# Patient Record
Sex: Male | Born: 1950 | Race: White | Hispanic: No | Marital: Single | Smoking: Former smoker
Health system: Southern US, Community
[De-identification: ages and names within clinical notes are randomized; demographics above are authoritative.]

## PROBLEM LIST (undated history)

## (undated) DIAGNOSIS — R011 Cardiac murmur, unspecified: Secondary | ICD-10-CM

## (undated) DIAGNOSIS — R062 Wheezing: Secondary | ICD-10-CM

## (undated) DIAGNOSIS — R42 Dizziness and giddiness: Secondary | ICD-10-CM

## (undated) DIAGNOSIS — M1990 Unspecified osteoarthritis, unspecified site: Secondary | ICD-10-CM

## (undated) DIAGNOSIS — I2699 Other pulmonary embolism without acute cor pulmonale: Secondary | ICD-10-CM

## (undated) DIAGNOSIS — K219 Gastro-esophageal reflux disease without esophagitis: Secondary | ICD-10-CM

## (undated) DIAGNOSIS — H9190 Unspecified hearing loss, unspecified ear: Secondary | ICD-10-CM

## (undated) DIAGNOSIS — Z9109 Other allergy status, other than to drugs and biological substances: Secondary | ICD-10-CM

## (undated) DIAGNOSIS — E7800 Pure hypercholesterolemia, unspecified: Secondary | ICD-10-CM

## (undated) DIAGNOSIS — K5790 Diverticulosis of intestine, part unspecified, without perforation or abscess without bleeding: Secondary | ICD-10-CM

## (undated) DIAGNOSIS — N200 Calculus of kidney: Secondary | ICD-10-CM

## (undated) DIAGNOSIS — K449 Diaphragmatic hernia without obstruction or gangrene: Secondary | ICD-10-CM

## (undated) HISTORY — PX: TONSILLECTOMY: SUR1361

## (undated) HISTORY — PX: OTHER SURGICAL HISTORY: SHX169

---
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
## 2006-04-11 DIAGNOSIS — IMO0001 Reserved for inherently not codable concepts without codable children: Secondary | ICD-10-CM | POA: Insufficient documentation

## 2006-05-22 ENCOUNTER — Ambulatory Visit: Payer: Self-pay | Admitting: Gastroenterology

## 2006-06-10 HISTORY — PX: COLONOSCOPY: SHX174

## 2007-04-21 LAB — HM COLONOSCOPY

## 2007-05-14 DIAGNOSIS — R351 Nocturia: Secondary | ICD-10-CM | POA: Insufficient documentation

## 2008-02-01 DIAGNOSIS — L259 Unspecified contact dermatitis, unspecified cause: Secondary | ICD-10-CM | POA: Insufficient documentation

## 2010-09-12 ENCOUNTER — Emergency Department: Payer: Self-pay

## 2013-01-05 ENCOUNTER — Ambulatory Visit: Payer: Self-pay | Admitting: Family Medicine

## 2013-11-08 ENCOUNTER — Ambulatory Visit: Payer: Self-pay | Admitting: Family Medicine

## 2014-05-21 DIAGNOSIS — N419 Inflammatory disease of prostate, unspecified: Secondary | ICD-10-CM | POA: Diagnosis not present

## 2014-06-28 DIAGNOSIS — Z125 Encounter for screening for malignant neoplasm of prostate: Secondary | ICD-10-CM | POA: Diagnosis not present

## 2014-06-28 DIAGNOSIS — Z23 Encounter for immunization: Secondary | ICD-10-CM | POA: Diagnosis not present

## 2014-06-28 DIAGNOSIS — Z0000 Encounter for general adult medical examination without abnormal findings: Secondary | ICD-10-CM | POA: Diagnosis not present

## 2014-06-28 DIAGNOSIS — Z1389 Encounter for screening for other disorder: Secondary | ICD-10-CM | POA: Diagnosis not present

## 2014-06-30 DIAGNOSIS — N400 Enlarged prostate without lower urinary tract symptoms: Secondary | ICD-10-CM | POA: Diagnosis not present

## 2014-06-30 DIAGNOSIS — E780 Pure hypercholesterolemia: Secondary | ICD-10-CM | POA: Diagnosis not present

## 2014-06-30 LAB — HEPATIC FUNCTION PANEL
ALT: 19 U/L (ref 10–40)
AST: 21 U/L (ref 14–40)
Alkaline Phosphatase: 77 U/L (ref 25–125)
BILIRUBIN, TOTAL: 0.4 mg/dL

## 2014-06-30 LAB — BASIC METABOLIC PANEL
BUN: 21 mg/dL (ref 4–21)
Creatinine: 1.6 mg/dL — AB (ref 0.6–1.3)
POTASSIUM: 3.9 mmol/L (ref 3.4–5.3)
Sodium: 144 mmol/L (ref 137–147)

## 2014-06-30 LAB — LIPID PANEL
CHOLESTEROL: 184 mg/dL (ref 0–200)
HDL: 55 mg/dL (ref 35–70)
LDL Cholesterol: 114 mg/dL
TRIGLYCERIDES: 73 mg/dL (ref 40–160)

## 2014-06-30 LAB — PSA: PSA: 18.4

## 2014-07-19 DIAGNOSIS — C61 Malignant neoplasm of prostate: Secondary | ICD-10-CM | POA: Diagnosis not present

## 2014-08-01 DIAGNOSIS — Z23 Encounter for immunization: Secondary | ICD-10-CM | POA: Diagnosis not present

## 2014-08-01 DIAGNOSIS — Z125 Encounter for screening for malignant neoplasm of prostate: Secondary | ICD-10-CM | POA: Diagnosis not present

## 2014-08-01 DIAGNOSIS — N400 Enlarged prostate without lower urinary tract symptoms: Secondary | ICD-10-CM | POA: Diagnosis not present

## 2014-08-01 DIAGNOSIS — R338 Other retention of urine: Secondary | ICD-10-CM | POA: Diagnosis not present

## 2014-08-17 DIAGNOSIS — R339 Retention of urine, unspecified: Secondary | ICD-10-CM | POA: Diagnosis not present

## 2014-09-23 DIAGNOSIS — D495 Neoplasm of unspecified behavior of other genitourinary organs: Secondary | ICD-10-CM | POA: Diagnosis not present

## 2014-09-29 DIAGNOSIS — Z125 Encounter for screening for malignant neoplasm of prostate: Secondary | ICD-10-CM | POA: Diagnosis not present

## 2014-09-29 DIAGNOSIS — N400 Enlarged prostate without lower urinary tract symptoms: Secondary | ICD-10-CM | POA: Diagnosis not present

## 2014-09-29 DIAGNOSIS — Z23 Encounter for immunization: Secondary | ICD-10-CM | POA: Diagnosis not present

## 2014-09-29 DIAGNOSIS — T801XXA Vascular complications following infusion, transfusion and therapeutic injection, initial encounter: Secondary | ICD-10-CM | POA: Diagnosis not present

## 2014-09-30 DIAGNOSIS — N400 Enlarged prostate without lower urinary tract symptoms: Secondary | ICD-10-CM | POA: Diagnosis not present

## 2014-10-03 DIAGNOSIS — Z125 Encounter for screening for malignant neoplasm of prostate: Secondary | ICD-10-CM | POA: Diagnosis not present

## 2014-10-03 DIAGNOSIS — R0789 Other chest pain: Secondary | ICD-10-CM | POA: Diagnosis not present

## 2014-10-03 DIAGNOSIS — T801XXD Vascular complications following infusion, transfusion and therapeutic injection, subsequent encounter: Secondary | ICD-10-CM | POA: Diagnosis not present

## 2014-10-03 DIAGNOSIS — Z23 Encounter for immunization: Secondary | ICD-10-CM | POA: Diagnosis not present

## 2014-10-13 ENCOUNTER — Ambulatory Visit
Admission: RE | Admit: 2014-10-13 | Discharge: 2014-10-13 | Disposition: A | Discharge status: Home / Self Care | Payer: Medicare Other | Source: Ambulatory Visit | Attending: Family Medicine | Admitting: Family Medicine

## 2014-10-13 ENCOUNTER — Other Ambulatory Visit: Payer: Self-pay | Admitting: Family Medicine

## 2014-10-13 DIAGNOSIS — Z125 Encounter for screening for malignant neoplasm of prostate: Secondary | ICD-10-CM | POA: Diagnosis not present

## 2014-10-13 DIAGNOSIS — R05 Cough: Secondary | ICD-10-CM

## 2014-10-13 DIAGNOSIS — Z23 Encounter for immunization: Secondary | ICD-10-CM | POA: Diagnosis not present

## 2014-10-13 DIAGNOSIS — R0989 Other specified symptoms and signs involving the circulatory and respiratory systems: Secondary | ICD-10-CM

## 2014-10-13 DIAGNOSIS — R059 Cough, unspecified: Secondary | ICD-10-CM

## 2014-10-13 DIAGNOSIS — R0789 Other chest pain: Secondary | ICD-10-CM | POA: Diagnosis not present

## 2014-10-13 DIAGNOSIS — R079 Chest pain, unspecified: Secondary | ICD-10-CM | POA: Diagnosis not present

## 2014-10-13 DIAGNOSIS — J90 Pleural effusion, not elsewhere classified: Secondary | ICD-10-CM | POA: Insufficient documentation

## 2014-10-13 LAB — CBC AND DIFFERENTIAL
HEMATOCRIT: 50 % (ref 41–53)
Hemoglobin: 16.9 g/dL (ref 13.5–17.5)
Neutrophils Absolute: 69 /uL
Platelets: 314 10*3/uL (ref 150–399)
WBC: 10.8 10^3/mL

## 2014-10-14 ENCOUNTER — Emergency Department
Admission: EM | Admit: 2014-10-14 | Discharge: 2014-10-15 | Disposition: A | Discharge status: Home / Self Care | Payer: Medicare Other | Attending: Emergency Medicine | Admitting: Emergency Medicine

## 2014-10-14 ENCOUNTER — Encounter: Payer: Self-pay | Admitting: *Deleted

## 2014-10-14 ENCOUNTER — Other Ambulatory Visit: Payer: Self-pay | Admitting: Family Medicine

## 2014-10-14 ENCOUNTER — Ambulatory Visit
Admission: RE | Admit: 2014-10-14 | Discharge: 2014-10-14 | Disposition: A | Discharge status: Home / Self Care | Payer: Medicare Other | Source: Ambulatory Visit | Attending: Family Medicine | Admitting: Family Medicine

## 2014-10-14 ENCOUNTER — Emergency Department: Payer: Medicare Other

## 2014-10-14 DIAGNOSIS — R079 Chest pain, unspecified: Secondary | ICD-10-CM | POA: Diagnosis present

## 2014-10-14 DIAGNOSIS — K449 Diaphragmatic hernia without obstruction or gangrene: Secondary | ICD-10-CM | POA: Diagnosis not present

## 2014-10-14 DIAGNOSIS — K760 Fatty (change of) liver, not elsewhere classified: Secondary | ICD-10-CM | POA: Insufficient documentation

## 2014-10-14 DIAGNOSIS — R0789 Other chest pain: Secondary | ICD-10-CM | POA: Diagnosis present

## 2014-10-14 DIAGNOSIS — J90 Pleural effusion, not elsewhere classified: Secondary | ICD-10-CM | POA: Insufficient documentation

## 2014-10-14 DIAGNOSIS — I2699 Other pulmonary embolism without acute cor pulmonale: Secondary | ICD-10-CM | POA: Insufficient documentation

## 2014-10-14 DIAGNOSIS — Z87891 Personal history of nicotine dependence: Secondary | ICD-10-CM | POA: Insufficient documentation

## 2014-10-14 DIAGNOSIS — R7989 Other specified abnormal findings of blood chemistry: Secondary | ICD-10-CM

## 2014-10-14 DIAGNOSIS — M7989 Other specified soft tissue disorders: Secondary | ICD-10-CM | POA: Diagnosis not present

## 2014-10-14 DIAGNOSIS — R791 Abnormal coagulation profile: Secondary | ICD-10-CM | POA: Diagnosis not present

## 2014-10-14 DIAGNOSIS — R0602 Shortness of breath: Secondary | ICD-10-CM | POA: Diagnosis not present

## 2014-10-14 DIAGNOSIS — J9811 Atelectasis: Secondary | ICD-10-CM | POA: Diagnosis not present

## 2014-10-14 LAB — CBC
HCT: 48.2 % (ref 40.0–52.0)
HEMOGLOBIN: 16.6 g/dL (ref 13.0–18.0)
MCH: 31 pg (ref 26.0–34.0)
MCHC: 34.5 g/dL (ref 32.0–36.0)
MCV: 90.1 fL (ref 80.0–100.0)
Platelets: 277 10*3/uL (ref 150–440)
RBC: 5.35 MIL/uL (ref 4.40–5.90)
RDW: 13.9 % (ref 11.5–14.5)
WBC: 14.5 10*3/uL — ABNORMAL HIGH (ref 3.8–10.6)

## 2014-10-14 LAB — BASIC METABOLIC PANEL
Anion gap: 8 (ref 5–15)
BUN: 20 mg/dL (ref 6–20)
CALCIUM: 8.7 mg/dL — AB (ref 8.9–10.3)
CO2: 27 mmol/L (ref 22–32)
CREATININE: 1.3 mg/dL — AB (ref 0.61–1.24)
Chloride: 102 mmol/L (ref 101–111)
GFR calc Af Amer: >60 mL/min (ref >60)
GFR calc non Af Amer: 56 mL/min — ABNORMAL LOW (ref >60)
GLUCOSE: 77 mg/dL (ref 65–99)
Potassium: 3.8 mmol/L (ref 3.5–5.1)
Sodium: 137 mmol/L (ref 135–145)

## 2014-10-14 MED ORDER — RIVAROXABAN 15 MG PO TABS
15.0000 mg | ORAL_TABLET | Freq: Once | ORAL | Status: AC
  Administered 2014-10-14: 15 mg via ORAL
  Filled 2014-10-14: qty 1

## 2014-10-14 MED ORDER — RIVAROXABAN (XARELTO) VTE STARTER PACK (15 & 20 MG): ORAL_TABLET | ORAL | Status: DC

## 2014-10-14 MED ORDER — IOHEXOL 350 MG/ML SOLN
100.0000 mL | Freq: Once | INTRAVENOUS | Status: AC | PRN
  Administered 2014-10-14: 100 mL via INTRAVENOUS

## 2014-10-14 NOTE — ED Notes (Signed)
Await paperwork and prescription. Pt

## 2014-10-14 NOTE — ED Notes (Signed)
Pt presented to primary doc w/ c;o right rib and chest pain x more than a week ago. Pt was treated at that time w/ muscle relaxers w/ further testing. Pt states developed a dry cough in past week w/ moderately resolving R chest pain. Pt presents w/ results of blood testing and imaging that confirm potential for PE.

## 2014-10-14 NOTE — Discharge Instructions (Signed)
You have a pulmonary emboli in the right lung. This appears stable. Your vital signs are within normal limits. Traction level is good. We agreed to use one of the newer anticoagulant medications for you. Take Xarelto twice a day as prescribed. This will be changed to a once a day dosing in 4 weeks.  Follow-up with your regular doctor this coming week.   Pulmonary Embolism A pulmonary (lung) embolism (PE) is a blood clot that has traveled to the lung and results in a blockage of blood flow in the affected lung. Most clots come from deep veins in the legs or pelvis. PE is a dangerous and potentially life-threatening condition that can be treated if identified. CAUSES Blood clots form in a vein for different reasons. Usually several things cause blood clots. They include:  The flow of blood slows down.  The inside of the vein is damaged in some way.  The person has a condition that makes the blood clot more easily. RISK FACTORS Some people are more likely than others to develop PE. Risk factors include:   Smoking.  Being overweight (obese).  Sitting or lying still for a long time. This includes long-distance travel, paralysis, or recovery from an illness or surgery. Other factors that increase risk are:   Older age, especially over 30 years of age.  Having a family history of blood clots or if you have already had a blood clot.  Having major or lengthy surgery. This is especially true for surgery on the hip, knee, or belly (abdomen). Hip surgery is particularly high risk.  Having a long, thin tube (catheter) placed inside a vein during a medical procedure.  Breaking a hip or leg.  Having cancer or cancer treatment.  Medicines containing the male hormone estrogen. This includes birth control pills and hormone replacement therapy.  Other circulation or heart problems.  Pregnancy and childbirth.  Hormone changes make the blood clot more easily during pregnancy.  The fetus puts  pressure on the veins of the pelvis.  There is a risk of injury to veins during delivery or a caesarean delivery. The risk is highest just after childbirth.  PREVENTION   Exercise the legs regularly. Take a brisk 30 minute walk every day.  Maintain a weight that is appropriate for your height.  Avoid sitting or lying in bed for long periods of time without moving your legs.  Women, particularly those over the age of 11 years, should consider the risks and benefits of taking estrogen medicines, including birth control pills.  Do not smoke, especially if you take estrogen medicines.  Long-distance travel can increase your risk. You should exercise your legs by walking or pumping the muscles every hour.  Many of the risk factors above relate to situations that exist with hospitalization, either for illness, injury, or elective surgery. Prevention may include medical and nonmedical measures.   Your health care provider will assess you for the need for venous thromboembolism prevention when you are admitted to the hospital. If you are having surgery, your surgeon will assess you the day of or day after surgery.  SYMPTOMS  The symptoms of a PE usually start suddenly and include:  Shortness of breath.  Coughing.  Coughing up blood or blood-tinged mucus.  Chest pain. Pain is often worse with deep breaths.  Rapid heartbeat. DIAGNOSIS  If a PE is suspected, your health care provider will take a medical history and perform a physical exam. Other tests that may be required include:  Blood tests, such as studies of the clotting properties of your blood.  Imaging tests, such as ultrasound, CT, MRI, and other tests to see if you have clots in your legs or lungs.  An electrocardiogram. This can look for heart strain from blood clots in the lungs. TREATMENT   The most common treatment for a PE is blood thinning (anticoagulant) medicine, which reduces the blood's tendency to clot.  Anticoagulants can stop new blood clots from forming and old clots from growing. They cannot dissolve existing clots. Your body does this by itself over time. Anticoagulants can be given by mouth, through an intravenous (IV) tube, or by injection. Your health care provider will determine the best program for you.  Less commonly, clot-dissolving medicines (thrombolytics) are used to dissolve a PE. They carry a high risk of bleeding, so they are used mainly in severe cases.  Very rarely, a blood clot in the leg needs to be removed surgically.  If you are unable to take anticoagulants, your health care provider may arrange for you to have a filter placed in a main vein in your abdomen. This filter prevents clots from traveling to your lungs. HOME CARE INSTRUCTIONS   Take all medicines as directed by your health care provider.  Learn as much as you can about DVT.  Wear a medical alert bracelet or carry a medical alert card.  Ask your health care provider how soon you can go back to normal activities. It is important to stay active to prevent blood clots. If you are on anticoagulant medicine, avoid contact sports.  It is very important to exercise. This is especially important while traveling, sitting, or standing for long periods of time. Exercise your legs by walking or by tightening and relaxing your leg muscles regularly. Take frequent walks.  You may need to wear compression stockings. These are tight elastic stockings that apply pressure to the lower legs. This pressure can help keep the blood in the legs from clotting. Taking Warfarin Warfarin is a daily medicine that is taken by mouth. Your health care provider will advise you on the length of treatment (usually 3-6 months, sometimes lifelong). If you take warfarin:  Understand how to take warfarin and foods that can affect how warfarin works in Veterinary surgeon.  Too much and too little warfarin are both dangerous. Too much warfarin increases  the risk of bleeding. Too little warfarin continues to allow the risk for blood clots. Warfarin and Regular Blood Testing While taking warfarin, you will need to have regular blood tests to measure your blood clotting time. These blood tests usually include both the prothrombin time (PT) and international normalized ratio (INR) tests. The PT and INR results allow your health care provider to adjust your dose of warfarin. It is very important that you have your PT and INR tested as often as directed by your health care provider.  Warfarin and Your Diet Avoid major changes in your diet, or notify your health care provider before changing your diet. Arrange a visit with a registered dietitian to answer your questions. Many foods, especially foods high in vitamin K, can interfere with warfarin and affect the PT and INR results. You should eat a consistent amount of foods high in vitamin K. Foods high in vitamin K include:   Spinach, kale, broccoli, cabbage, collard and turnip greens, Brussels sprouts, peas, cauliflower, seaweed, and parsley.  Beef and pork liver.  Green tea.  Soybean oil. Warfarin with Other Medicines Many medicines can  interfere with warfarin and affect the PT and INR results. You must:  Tell your health care provider about any and all medicines, vitamins, and supplements you take, including aspirin and other over-the-counter anti-inflammatory medicines. Be especially cautious with aspirin and anti-inflammatory medicines. Ask your health care provider before taking these.  Do not take or discontinue any prescribed or over-the-counter medicine except on the advice of your health care provider or pharmacist. Warfarin Side Effects Warfarin can have side effects, such as easy bruising and difficulty stopping bleeding. Ask your health care provider or pharmacist about other side effects of warfarin. You will need to:  Hold pressure over cuts for longer than usual.  Notify your  dentist and other health care providers that you are taking warfarin before you undergo any procedures where bleeding may occur. Warfarin with Alcohol and Tobacco   Drinking alcohol frequently can increase the effect of warfarin, leading to excess bleeding. It is best to avoid alcoholic drinks or consume only very small amounts while taking warfarin. Notify your health care provider if you change your alcohol intake.  Do not use any tobacco products including cigarettes, chewing tobacco, or electronic cigarettes. If you smoke, quit. Ask your health care provider for help with quitting smoking. Alternative Medicines to Warfarin: Factor Xa Inhibitor Medicines  These blood thinning medicines are taken by mouth, usually for several weeks or longer. It is important to take the medicine every single day, at the same time each day.  There are no regular blood tests required when using these medicines.  There are fewer food and drug interactions than with warfarin.  The side effects of this class of medicine is similar to that of warfarin, including excessive bruising or bleeding. Ask your health care provider or pharmacist about other potential side effects. SEEK MEDICAL CARE IF:   You notice a rapid heartbeat.  You feel weaker or more tired than usual.  You feel faint.  You notice increased bruising.  Your symptoms are not getting better in the time expected.  You are having side effects of medicine. SEEK IMMEDIATE MEDICAL CARE IF:   You have chest pain.  You have trouble breathing.  You have new or increased swelling or pain in one leg.  You cough up blood.  You notice blood in vomit, in a bowel movement, or in urine.  You have a fever. Symptoms of PE may represent a serious problem that is an emergency. Do not wait to see if the symptoms will go away. Get medical help right away. Call your local emergency services (911 in the [place]). Do not drive yourself to the  hospital. Document Released: 05/24/2000 Document Revised: 10/11/2013 Document Reviewed: 06/07/2013 [facility] Patient Information 2015 [place], [place]. This information is not intended to replace advice given to you by your health care provider. Make sure you discuss any questions you have with your health care provider.

## 2014-10-14 NOTE — ED Notes (Signed)
Called [place] to get ETA and they state he is next

## 2014-10-14 NOTE — ED Notes (Signed)
Await results from doppler and then pt will be d/c.  Pt clm and patient

## 2014-10-14 NOTE — ED Provider Notes (Signed)
[facility] Emergency Department Provider Note  ____________________________________________  Time seen: 1930  I have reviewed the triage vital signs and the nursing notes.   HISTORY  Chief Complaint Chest Pain  right-sided chest pain, noted pulmonary emboli    HPI Jacob Patterson is a 64 y.o. male who woke up with right-sided chest pain approximately a week and a half ago. He was seen by his primary care physician, Dr. Jeananne Rama. Dr. Maurie Boettcher treated the patient for a pulled muscle. The patient traveled to [place] and back. Upon return he saw Dr. Janae Bridgeman again who pursued further evaluation which included chest x-ray and some blood test yesterday. Due to the results from that evaluation, the patient underwent CT scan chest angiogram today. This revealed that he had a small to moderate area of infarction from pulmonary emboli with a small clot load. He was directed to the emergency department for further evaluation treatment. At this time the patient is not having any chest pain or shortness of breath. His pain previously would be moderate times. He has no prior history of blood clots.   History reviewed. No pertinent past medical history.  There are no active problems to display for this patient.   No past surgical history on file.  No current outpatient prescriptions on file.  Allergies Review of patient's allergies indicates no known allergies.  No family history on file.  Social History History  Substance Use Topics  . Smoking status: Former Smoker    Types: Cigarettes  . Smokeless tobacco: Not on file  . Alcohol Use: Not on file    Review of Systems Constitutional: Negative for fever. ENT: Negative for sore throat. Cardiovascular: Positive for right-sided chest pain as noted in history of present illness  Respiratory: Positive for mild shortness of breath. None currently. Gastrointestinal: Negative for abdominal pain, vomiting and  diarrhea. Genitourinary: Negative for dysuria. Musculoskeletal: Negative for back pain. Skin: Negative for rash. Neurological: Negative for headaches Hematological/Lymphatic:No prior history of blood clots. Patient is noted to be positive for PE at this time.  10-point ROS otherwise negative.  ____________________________________________   PHYSICAL EXAM:  VITAL SIGNS: ED Triage Vitals  Enc Vitals Group     BP 10/14/14 1923 119/85 mmHg     Pulse Rate 10/14/14 1923 100     Resp 10/14/14 1923 18     Temp 10/14/14 1923 98.2 F (36.8 C)     Temp Source 10/14/14 1923 Oral     SpO2 10/14/14 1923 96 %     Weight 10/14/14 1925 183 lb 6.4 oz (83.19 kg)     Height --      Head Cir --      Peak Flow --      Pain Score 10/14/14 1925 0     Pain Loc --      Pain Edu? --      Excl. in [place]? --     Constitutional: Alert and oriented. Well appearing and in no distress. ENT   Head: Normocephalic and atraumatic.   Nose: No congestion/rhinnorhea.   Mouth/Throat: Mucous membranes are moist. Cardiovascular: Normal rate at 91, regular rhythm. Respiratory: Normal respiratory effort without tachypnea. Breath sounds are clear and equal bilaterally. No wheezes/rales/rhonchi. Oxygen saturation normal at 99% on room air. Gastrointestinal: Soft and nontender. No distention.  Back: There is no CVA tenderness. Musculoskeletal: Nontender with normal range of motion in all extremities.  No noted edema. Legs are of equal size bilaterally. Patient reports that his  legs are normal without any change. Neurologic:  Normal speech and language. No gross focal neurologic deficits are appreciated.  Skin:  Skin is warm, dry. No rash noted. Psychiatric: Mood and affect are normal. Speech and behavior are normal.  ____________________________________________     INITIAL IMPRESSION / ASSESSMENT AND PLAN / ED COURSE  The patient arrives with his diagnosis of pulmonary emboli already made by CT scan  earlier today. He is essentially asymptomatic and looks well. We have discussed treatment options including the use of the newer anticoagulants. We have spoken about the risks of these anticoagulants, as there is no readily available immediate reversal agent for them. The patient understands these risks and agrees with ongoing treatment with Xarelto at this time. He has close follow-up available. We expect revealed to discharge him home. While Doppler ultrasounds will not change his clinical course at this point, we will get bilateral Doppler ultrasounds of his legs to evaluate for DVT.   LABS (pertinent positives/negatives)  White blood cell count of 14.5 normal hemoglobin level of 16.6 electrolytes are within normal limits.  ____________________________________________   EKG  At 1917 normal sinus rhythm at rate of 92 with normal intervals. There are flipped T waves in lead 3  ____________________________________________    RADIOLOGY  CAT scan done as an outpatient bar for arrival shows small to medium infarction consistent with pulmonary emboli in the right lung. There is a small clot burden.  Ultrasound of the lower extremities is pending at this time.  ____________________________________________   PROCEDURES  Procedure(s) performed: None  Critical Care performed: No  ____________________________________________  ----------------------------------------- 10:24 PM on 10/14/2014 -----------------------------------------  At this hour we are still waiting for Doppler ultrasounds of lower extremity to be completed. Given the patient's stable appearance we plan on discharging him home on Xarelto. This was discussed with him in detail. Results from the Doppler studies of his lower extremities will not change this plan. I will ask for my colleague to follow-up on the results and communicated the patient.  ____________________________________________   FINAL CLINICAL  IMPRESSION(S) / ED DIAGNOSES  Final diagnoses:  Pulmonary emboli      Ahmed Prima, MD 10/14/14 2226

## 2014-10-25 DIAGNOSIS — R05 Cough: Secondary | ICD-10-CM | POA: Diagnosis not present

## 2014-10-25 DIAGNOSIS — Z125 Encounter for screening for malignant neoplasm of prostate: Secondary | ICD-10-CM | POA: Diagnosis not present

## 2014-10-25 DIAGNOSIS — Z23 Encounter for immunization: Secondary | ICD-10-CM | POA: Diagnosis not present

## 2014-10-25 DIAGNOSIS — I2699 Other pulmonary embolism without acute cor pulmonale: Secondary | ICD-10-CM | POA: Diagnosis not present

## 2014-10-31 DIAGNOSIS — J90 Pleural effusion, not elsewhere classified: Secondary | ICD-10-CM | POA: Insufficient documentation

## 2014-10-31 DIAGNOSIS — N200 Calculus of kidney: Secondary | ICD-10-CM | POA: Insufficient documentation

## 2014-10-31 DIAGNOSIS — N400 Enlarged prostate without lower urinary tract symptoms: Secondary | ICD-10-CM | POA: Insufficient documentation

## 2014-10-31 DIAGNOSIS — E7800 Pure hypercholesterolemia, unspecified: Secondary | ICD-10-CM | POA: Insufficient documentation

## 2014-10-31 DIAGNOSIS — I809 Phlebitis and thrombophlebitis of unspecified site: Secondary | ICD-10-CM | POA: Insufficient documentation

## 2014-10-31 DIAGNOSIS — K219 Gastro-esophageal reflux disease without esophagitis: Secondary | ICD-10-CM | POA: Insufficient documentation

## 2014-10-31 DIAGNOSIS — R002 Palpitations: Secondary | ICD-10-CM | POA: Insufficient documentation

## 2014-10-31 DIAGNOSIS — T801XXA Vascular complications following infusion, transfusion and therapeutic injection, initial encounter: Secondary | ICD-10-CM | POA: Insufficient documentation

## 2014-10-31 DIAGNOSIS — M1990 Unspecified osteoarthritis, unspecified site: Secondary | ICD-10-CM | POA: Insufficient documentation

## 2014-10-31 DIAGNOSIS — K4090 Unilateral inguinal hernia, without obstruction or gangrene, not specified as recurrent: Secondary | ICD-10-CM | POA: Insufficient documentation

## 2014-10-31 DIAGNOSIS — R972 Elevated prostate specific antigen [PSA]: Secondary | ICD-10-CM | POA: Insufficient documentation

## 2014-10-31 DIAGNOSIS — R7989 Other specified abnormal findings of blood chemistry: Secondary | ICD-10-CM | POA: Insufficient documentation

## 2014-10-31 DIAGNOSIS — M25559 Pain in unspecified hip: Secondary | ICD-10-CM | POA: Insufficient documentation

## 2014-12-06 ENCOUNTER — Encounter: Payer: Self-pay | Admitting: Family Medicine

## 2014-12-06 ENCOUNTER — Ambulatory Visit (INDEPENDENT_AMBULATORY_CARE_PROVIDER_SITE_OTHER): Payer: Medicare Other | Admitting: Family Medicine

## 2014-12-06 VITALS — BP 98/62 | HR 78 | Temp 98.5°F | Resp 16 | Wt 188.4 lb

## 2014-12-06 DIAGNOSIS — J069 Acute upper respiratory infection, unspecified: Secondary | ICD-10-CM | POA: Diagnosis not present

## 2014-12-06 DIAGNOSIS — Z86711 Personal history of pulmonary embolism: Secondary | ICD-10-CM | POA: Diagnosis not present

## 2014-12-06 DIAGNOSIS — R5383 Other fatigue: Secondary | ICD-10-CM

## 2014-12-06 DIAGNOSIS — M25511 Pain in right shoulder: Secondary | ICD-10-CM | POA: Diagnosis not present

## 2014-12-06 NOTE — Progress Notes (Signed)
Subjective:    Patient ID: Jacob Patterson, male    DOB: 07/08/1950, 64 y.o.   MRN: [id number]  HPI Follow-up: Initially had sharp right chest pains suspected secondary to phlebitis following IV contrast for MRI and chest x-ray showed right pleural efusion on 10-13-14. On 10-14-14 D-Dimer was 3.97 and CTA of chest showed a small RLL PE with right pleural effusion and a small RLL pulmonary infarct. Treated at [facility] and presently on Eliquis 5 mg BID. Intermittent right chest soreness but no hemoptysis or dyspnea.  Developed "foggy fatigue" the past couple days. Progressed to crusting of eyes, sinus pressure, occipital pressure headache, dry cough and right shoulder discomfort. Family history positive for father having arthritis. Aleve some help with shoulder discomfort. Eating 2 meals a day with some loose stools.  Patient Active Problem List   Diagnosis Date Noted  . Arthritis 10/31/2014  . 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 D-dimer, elevated 10/31/2014  . Abnormal prostate specific antigen 10/31/2014  . Acid reflux 10/31/2014  . Hypercholesteremia 10/31/2014  . Hernia, inguinal 10/31/2014  . Arthralgia of hip 10/31/2014  . Awareness of heartbeats 10/31/2014  . Pleural cavity effusion 10/31/2014  . Phlebitis after infusion 10/31/2014  . Calculus of kidney 10/31/2014  . CD (contact dermatitis) 02/01/2008  . Excessive urination at night 05/14/2007   Past Surgical History  Procedure Laterality Date  . Tonsillectomy and adenoidectomy  1960  . Colonoscopy  2008   History  Substance Use Topics  . Smoking status: Former Smoker    Types: Cigarettes  . Smokeless tobacco: Not on file     Comment: Quit in 1989  . Alcohol Use: 0.0 oz/week    0 Standard drinks or equivalent per week     Comment: occasionally drinks wine or beer   Family History  Problem Relation Age of Onset  . Arthritis Mother   . Stroke Mother   . Arthritis Father   . Dementia Father   . Cataracts Father   .  Hyperlipidemia Sister   . Breast cancer Paternal Grandmother   . Heart attack Paternal Grandfather    Current Outpatient Prescriptions on File Prior to Visit  Medication Sig Dispense Refill  . aspirin EC 81 MG tablet Take 81 mg by mouth every evening.    Marland Kitchen B-Complex TABS Take 1 tablet by mouth every morning.    . Cholecalciferol (VITAMIN D3) 2000 UNITS TABS Take 1 tablet by mouth every morning.    . finasteride (PROSCAR) 5 MG tablet Take 5 mg by mouth every morning.    . Magnesium 250 MG TABS Take 1 tablet by mouth every morning.    . Melatonin 1 MG TABS Take 0.25 tablets by mouth as needed.    . Multiple Vitamins-Minerals (MULTIVITAMIN PO) Take 1 tablet by mouth every morning.    . naproxen sodium (ANAPROX) 220 MG tablet Take 220 mg by mouth 2 (two) times daily.    . tamsulosin (FLOMAX) 0.4 MG CAPS capsule Take 0.4 mg by mouth daily after supper.    . TURMERIC PO Take 1 capsule by mouth every morning.     No current facility-administered medications on file prior to visit.   No Known Allergies   Review of Systems  Constitutional: Positive for fatigue. Negative for fever.  HENT: Positive for congestion and sinus pressure. Negative for ear pain, nosebleeds and rhinorrhea.   Eyes: Negative.   Respiratory: Positive for cough. Negative for wheezing.   Cardiovascular: Negative.   Gastrointestinal: Positive  for diarrhea. Negative for nausea, vomiting, abdominal pain and constipation.  Genitourinary: Negative.   Musculoskeletal: Negative.   Hematological: Negative.       BP 98/62 mmHg  Pulse 78  Temp(Src) 98.5 F (36.9 C) (Oral)  Resp 16  Wt 188 lb 6.4 oz (85.458 kg)  SpO2 95%  Objective:   Physical Exam  Constitutional: He is oriented to person, place, and time. He appears well-developed and well-nourished.  HENT:  Head: Normocephalic and atraumatic.  Right Ear: External ear normal.  Left Ear: External ear normal.  Nose: Nose normal.  Mouth/Throat: Oropharynx is clear and  moist.  Eyes: Conjunctivae and EOM are normal. Pupils are equal, round, and reactive to light.  Neck: Normal range of motion. Neck supple.  Cardiovascular: Normal rate, regular rhythm, normal heart sounds and intact distal pulses.   Many clusters of varicose veins (superficial) in both legs. Non-tender and no significant edema.  Pulmonary/Chest: Effort normal and breath sounds normal.  Abdominal: Soft. Bowel sounds are normal.  Neurological: He is alert and oriented to person, place, and time.  Skin: Skin is warm and dry.          Assessment & Plan:  1. History of pulmonary embolus (PE) Had pulmonary embolus with small infarct and effusion in the right lateral lung. Tolerating Eliquis 5 mg BID with good compliance. No hemoptysis or chest pains.  2. Other fatigue Recent onset with URI symptoms beginning after onset. Will check labs for dehydration, electrolyte imbalance, liver dysfunction or kidney dysfunction from new medication. Encouraged to drink extra fluids and limit heat exposure. With BP low normal today (98/62), may restrict Tamsulosin to qod dosing. Recheck prn. - Comprehensive metabolic panel - CBC with Differential/Platelet  3. Right shoulder pain Recent onset without known significant injury. May try moist heat applications and Tylenol prn. Recommend gentle ROM rehab exercises. Recheck if no better in 7-10 days. May need x-ray evaluation or orthopedic referral.  4. Upper respiratory infection Recent cough and slight congestion with headache. Will check CBC with diff for signs of bacterial versus viral infection. May use Claritin-D or Mucinex-DM prn. Increase fluid intake and recheck prn.

## 2014-12-07 LAB — CBC WITH DIFFERENTIAL/PLATELET
BASOS ABS: 0 10*3/uL (ref 0.0–0.2)
Basos: 0 %
EOS (ABSOLUTE): 0.8 10*3/uL — AB (ref 0.0–0.4)
Eos: 8 %
Hematocrit: 49.1 % (ref 37.5–51.0)
Hemoglobin: 16.5 g/dL (ref 12.6–17.7)
IMMATURE GRANULOCYTES: 0 %
Immature Grans (Abs): 0 10*3/uL (ref 0.0–0.1)
Lymphocytes Absolute: 0.7 10*3/uL (ref 0.7–3.1)
Lymphs: 7 %
MCH: 31 pg (ref 26.6–33.0)
MCHC: 33.6 g/dL (ref 31.5–35.7)
MCV: 92 fL (ref 79–97)
MONOS ABS: 1 10*3/uL — AB (ref 0.1–0.9)
Monocytes: 10 %
Neutrophils Absolute: 7.3 10*3/uL — ABNORMAL HIGH (ref 1.4–7.0)
Neutrophils: 75 %
PLATELETS: 186 10*3/uL (ref 150–379)
RBC: 5.33 x10E6/uL (ref 4.14–5.80)
RDW: 14.3 % (ref 12.3–15.4)
WBC: 9.9 10*3/uL (ref 3.4–10.8)

## 2014-12-07 LAB — COMPREHENSIVE METABOLIC PANEL
ALT: 35 IU/L (ref 0–44)
AST: 37 IU/L (ref 0–40)
Albumin/Globulin Ratio: 1.5 (ref 1.1–2.5)
Albumin: 3.7 g/dL (ref 3.6–4.8)
Alkaline Phosphatase: 78 IU/L (ref 39–117)
BUN / CREAT RATIO: 12 (ref 10–22)
BUN: 18 mg/dL (ref 8–27)
Bilirubin Total: 0.6 mg/dL (ref 0.0–1.2)
CALCIUM: 10 mg/dL (ref 8.6–10.2)
CHLORIDE: 104 mmol/L (ref 97–108)
CO2: 24 mmol/L (ref 18–29)
Creatinine, Ser: 1.54 mg/dL — ABNORMAL HIGH (ref 0.76–1.27)
GFR calc non Af Amer: 47 mL/min/{1.73_m2} — ABNORMAL LOW (ref >59)
GFR, EST AFRICAN AMERICAN: 54 mL/min/{1.73_m2} — AB (ref >59)
Globulin, Total: 2.5 g/dL (ref 1.5–4.5)
Glucose: 114 mg/dL — ABNORMAL HIGH (ref 65–99)
POTASSIUM: 4.6 mmol/L (ref 3.5–5.2)
Sodium: 143 mmol/L (ref 134–144)
TOTAL PROTEIN: 6.2 g/dL (ref 6.0–8.5)

## 2014-12-08 ENCOUNTER — Telehealth: Payer: Self-pay

## 2014-12-08 NOTE — Telephone Encounter (Signed)
LMTCB

## 2014-12-08 NOTE — Telephone Encounter (Signed)
Patient advised as directed below. Patient verbalized understanding. Patient scheduled for a 4 week follow up appointment on 7/28.

## 2014-12-08 NOTE — Telephone Encounter (Signed)
-----   Message from Margo Common, [place] sent at 12/08/2014 12:48 AM EDT ----- WBC count back to normal but still a little shift on differential with congestion symptoms. Blood sugar slightly above fasting range. If not fasting when blood drawn, probably OK. Kidney function shows extra strain with Creatinine 1.54. Recommend increase in water intake and recheck levels in 3-4 weeks.

## 2014-12-09 ENCOUNTER — Telehealth: Payer: Self-pay | Admitting: Family Medicine

## 2014-12-09 NOTE — Telephone Encounter (Signed)
States this lump started to the left of his navel and had moved then disappeared. No diarrhea or constipation. Some bowel gas issues but not pain to lump or abdomen. Advised to drink extra water and get more fiber in diet. Recheck in the office if it returns.

## 2014-12-09 NOTE — Telephone Encounter (Signed)
Pt called saying that he has a knot in his abdominal area that has been moving around that last day or so.  Please call him back at [phone].  Thanks TP

## 2015-01-05 ENCOUNTER — Ambulatory Visit (INDEPENDENT_AMBULATORY_CARE_PROVIDER_SITE_OTHER): Payer: Medicare Other | Admitting: Family Medicine

## 2015-01-05 ENCOUNTER — Encounter: Payer: Self-pay | Admitting: Family Medicine

## 2015-01-05 VITALS — BP 106/62 | HR 85 | Temp 98.0°F | Resp 16 | Wt 186.4 lb

## 2015-01-05 DIAGNOSIS — R7989 Other specified abnormal findings of blood chemistry: Secondary | ICD-10-CM

## 2015-01-05 DIAGNOSIS — R748 Abnormal levels of other serum enzymes: Secondary | ICD-10-CM

## 2015-01-05 DIAGNOSIS — Z86711 Personal history of pulmonary embolism: Secondary | ICD-10-CM

## 2015-01-05 NOTE — Progress Notes (Signed)
Patient ID: Jacob Patterson, male   DOB: 1950-11-16, 64 y.o.   MRN: [id number]  Chief Complaint  Patient presents with  . Follow-up   Subjective:  HPI  This 64 year old male had elevation of creatinine on labs on 12-06-14 at follow up of pulmonary embolus with small infarct and effusion in the right lateral lung. No longer having pain in chest or dyspnea. Still on the Eliquis 5 mg BID.    Prior to Admission medications   Medication Sig Start Date End Date Taking? Authorizing Provider  aspirin EC 81 MG tablet Take 81 mg by mouth every evening.   Yes Historical Provider, MD  B-Complex TABS Take 1 tablet by mouth every morning.   Yes Historical Provider, MD  Cholecalciferol (VITAMIN D3) 2000 UNITS TABS Take 1 tablet by mouth every morning.   Yes Historical Provider, MD  finasteride (PROSCAR) 5 MG tablet Take 5 mg by mouth every morning.   Yes Historical Provider, MD  Magnesium 250 MG TABS Take 1 tablet by mouth every morning.   Yes Historical Provider, MD  Melatonin 1 MG TABS Take 0.25 tablets by mouth as needed.   Yes Historical Provider, MD  Multiple Vitamins-Minerals (MULTIVITAMIN PO) Take 1 tablet by mouth every morning.   Yes Historical Provider, MD  tamsulosin (FLOMAX) 0.4 MG CAPS capsule Take 0.4 mg by mouth daily after supper.   Yes Historical Provider, MD  TURMERIC PO Take 1 capsule by mouth every morning.   Yes Historical Provider, MD    Patient Active Problem List   Diagnosis Date Noted  . Arthritis 10/31/2014  . 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 D-dimer, elevated 10/31/2014  . Abnormal prostate specific antigen 10/31/2014  . Acid reflux 10/31/2014  . Hypercholesteremia 10/31/2014  . Hernia, inguinal 10/31/2014  . Arthralgia of hip 10/31/2014  . Awareness of heartbeats 10/31/2014  . Pleural cavity effusion 10/31/2014  . Phlebitis after infusion 10/31/2014  . Calculus of kidney 10/31/2014  . CD (contact dermatitis) 02/01/2008  . Excessive urination at night 05/14/2007   . Can't get food down 04/11/2006    History reviewed. No pertinent past medical history.  History   Social History  . Marital Status: Single    Spouse Name: N/A  . Number of Children: N/A  . Years of Education: N/A   Occupational History  . Not on file.   Social History Main Topics  . Smoking status: Former Smoker    Types: Cigarettes  . Smokeless tobacco: Not on file     Comment: Quit in 1989  . Alcohol Use: 0.0 oz/week    0 Standard drinks or equivalent per week     Comment: occasionally drinks wine or beer  . Drug Use: No  . Sexual Activity: No   Other Topics Concern  . Not on file   Social History Narrative   Family History  Problem Relation Age of Onset  . Arthritis Mother   . Stroke Mother   . Arthritis Father   . Dementia Father   . Cataracts Father   . Hyperlipidemia Sister   . Breast cancer Paternal Grandmother   . Heart attack Paternal Grandfather    Past Surgical History  Procedure Laterality Date  . Tonsillectomy and adenoidectomy  1960  . Colonoscopy  2008    No Known Allergies  Review of Systems  Constitutional: Negative.   HENT: Negative.   Eyes: Negative.   Respiratory: Negative.        Rare soreness in  chest.  Cardiovascular: Negative.   Gastrointestinal: Negative.   Genitourinary: Negative.   All other systems reviewed and are negative.  Immunization History  Administered Date(s) Administered  . Pneumococcal Conjugate-13 06/28/2014   Objective:  BP 106/62 mmHg  Pulse 85  Temp(Src) 98 F (36.7 C) (Oral)  Resp 16  Wt 186 lb 6.4 oz (84.55 kg)  SpO2 95%  Physical Exam  Constitutional: He is oriented to person, place, and time and well-developed, well-nourished, and in no distress.  HENT:  Head: Normocephalic and atraumatic.  Right Ear: External ear normal.  Left Ear: External ear normal.  Eyes: EOM are normal. Pupils are equal, round, and reactive to light.  Neck: Normal range of motion. Neck supple.  Cardiovascular:  Normal rate, regular rhythm and normal heart sounds.   Pulmonary/Chest: Breath sounds normal.  Abdominal: Soft. Bowel sounds are normal.  Neurological: He is alert and oriented to person, place, and time.  Skin: Skin is warm and dry.  Psychiatric: Mood, affect and judgment normal.    Lab Results  Component Value Date   WBC 9.9 12/06/2014   HGB 16.6 10/14/2014   HCT 49.1 12/06/2014   PLT 277 10/14/2014   GLUCOSE 114* 12/06/2014   CHOL 184 06/30/2014   TRIG 73 06/30/2014   HDL 55 06/30/2014   LDLCALC 114 06/30/2014   PSA 18.4 06/30/2014    CMP     Component Value Date/Time   NA 143 12/06/2014 1243   NA 137 10/14/2014 2020   K 4.6 12/06/2014 1243   CL 104 12/06/2014 1243   CO2 24 12/06/2014 1243   GLUCOSE 114* 12/06/2014 1243   GLUCOSE 77 10/14/2014 2020   BUN 18 12/06/2014 1243   BUN 20 10/14/2014 2020   CREATININE 1.54* 12/06/2014 1243   CREATININE 1.6* 06/30/2014   CALCIUM 10.0 12/06/2014 1243   PROT 6.2 12/06/2014 1243   AST 37 12/06/2014 1243   ALT 35 12/06/2014 1243   ALKPHOS 78 12/06/2014 1243   BILITOT 0.6 12/06/2014 1243   GFRNONAA 47* 12/06/2014 1243   GFRAA 54* 12/06/2014 1243    Assessment and Plan :  1. Elevated serum creatinine Diagnosed at follow up of pulmonary embolus on 12-06-14. Feeling improved now. Creatinine was 1.54 at that time. Will recheck labs. Recheck pending reports - CBC with Differential/Platelet - Comprehensive metabolic panel  2. Hx of pulmonary embolus Diagnosed 10-13-14 with right pleural effusion and RLL pulmonary infarct (Small). Feeling better and stronger. No dyspnea or chest pains. Recheck pending lab reports.  Miguel Aschoff MD [facility] 01/05/2015 11:14 AM

## 2015-01-06 ENCOUNTER — Telehealth: Payer: Self-pay

## 2015-01-06 LAB — COMPREHENSIVE METABOLIC PANEL
ALT: 20 IU/L (ref 0–44)
AST: 23 IU/L (ref 0–40)
Albumin/Globulin Ratio: 1.9 (ref 1.1–2.5)
Albumin: 4.3 g/dL (ref 3.6–4.8)
Alkaline Phosphatase: 75 IU/L (ref 39–117)
BILIRUBIN TOTAL: 0.5 mg/dL (ref 0.0–1.2)
BUN / CREAT RATIO: 12 (ref 10–22)
BUN: 18 mg/dL (ref 8–27)
CHLORIDE: 104 mmol/L (ref 97–108)
CO2: 24 mmol/L (ref 18–29)
CREATININE: 1.5 mg/dL — AB (ref 0.76–1.27)
Calcium: 10.5 mg/dL — ABNORMAL HIGH (ref 8.6–10.2)
GFR calc non Af Amer: 48 mL/min/{1.73_m2} — ABNORMAL LOW (ref >59)
GFR, EST AFRICAN AMERICAN: 56 mL/min/{1.73_m2} — AB (ref >59)
GLUCOSE: 114 mg/dL — AB (ref 65–99)
Globulin, Total: 2.3 g/dL (ref 1.5–4.5)
POTASSIUM: 4.7 mmol/L (ref 3.5–5.2)
Sodium: 145 mmol/L — ABNORMAL HIGH (ref 134–144)
Total Protein: 6.6 g/dL (ref 6.0–8.5)

## 2015-01-06 LAB — CBC WITH DIFFERENTIAL/PLATELET
BASOS ABS: 0.1 10*3/uL (ref 0.0–0.2)
Basos: 1 %
EOS (ABSOLUTE): 0.4 10*3/uL (ref 0.0–0.4)
EOS: 6 %
HEMATOCRIT: 49.4 % (ref 37.5–51.0)
Hemoglobin: 17.2 g/dL (ref 12.6–17.7)
Immature Grans (Abs): 0 10*3/uL (ref 0.0–0.1)
Immature Granulocytes: 0 %
LYMPHS: 27 %
Lymphocytes Absolute: 2 10*3/uL (ref 0.7–3.1)
MCH: 30.8 pg (ref 26.6–33.0)
MCHC: 34.8 g/dL (ref 31.5–35.7)
MCV: 88 fL (ref 79–97)
MONOCYTES: 14 %
Monocytes Absolute: 1 10*3/uL — ABNORMAL HIGH (ref 0.1–0.9)
Neutrophils Absolute: 3.7 10*3/uL (ref 1.4–7.0)
Neutrophils: 52 %
PLATELETS: 276 10*3/uL (ref 150–379)
RBC: 5.59 x10E6/uL (ref 4.14–5.80)
RDW: 14.4 % (ref 12.3–15.4)
WBC: 7.3 10*3/uL (ref 3.4–10.8)

## 2015-01-06 NOTE — Telephone Encounter (Signed)
Patient advised as directed below. Patient verbalized understanding and agrees with treatment plan. 

## 2015-01-06 NOTE — Telephone Encounter (Signed)
-----   Message from Margo Common, [place] sent at 01/06/2015  2:10 PM EDT ----- Normal blood counts. Creatinine only slightly lower and blood sugar the same at 114. Continue to drink extra fluids and recheck in 3 months (sooner if needed).

## 2015-03-27 ENCOUNTER — Ambulatory Visit (INDEPENDENT_AMBULATORY_CARE_PROVIDER_SITE_OTHER): Payer: Medicare Other | Admitting: Family Medicine

## 2015-03-27 ENCOUNTER — Encounter: Payer: Self-pay | Admitting: Family Medicine

## 2015-03-27 VITALS — BP 118/78 | HR 72 | Temp 97.7°F | Resp 16 | Wt 186.0 lb

## 2015-03-27 DIAGNOSIS — N400 Enlarged prostate without lower urinary tract symptoms: Secondary | ICD-10-CM | POA: Diagnosis not present

## 2015-03-27 DIAGNOSIS — K429 Umbilical hernia without obstruction or gangrene: Secondary | ICD-10-CM

## 2015-03-27 DIAGNOSIS — R972 Elevated prostate specific antigen [PSA]: Secondary | ICD-10-CM | POA: Insufficient documentation

## 2015-03-27 DIAGNOSIS — Z8709 Personal history of other diseases of the respiratory system: Secondary | ICD-10-CM

## 2015-03-27 MED ORDER — APIXABAN 5 MG PO TABS: 5.0000 mg | ORAL_TABLET | Freq: Two times a day (BID) | ORAL | Status: DC

## 2015-03-27 NOTE — Progress Notes (Signed)
Patient ID: MARCIA LEPERA, male   DOB: Feb 27, 1951, 64 y.o.   MRN: [id number] Name: Jacob Patterson   MRN: [id number]    DOB: 04/19/1951   Date:03/27/2015       Progress Note  Subjective  Chief Complaint  Chief Complaint  Patient presents with  . Medication Refill    HPI Patient states he was prescribed Eliquis at the ER. RX will run out at the end of the month. Patient is following up to see if he should continue taking Eliquis. Also, developed an abdominal "knot" sensation behind navel occasional with slight tenderness occasionally. No diarrhea, constipation, injury, hematemesis or melena. Slight heartburn with relief from TUMS.   Patient Active Problem List   Diagnosis Date Noted  . Elevated prostate specific antigen (PSA) 03/27/2015  . Arthritis 10/31/2014  . 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 D-dimer, elevated 10/31/2014  . Abnormal prostate specific antigen 10/31/2014  . Acid reflux 10/31/2014  . Hypercholesteremia 10/31/2014  . Hernia, inguinal 10/31/2014  . Arthralgia of hip 10/31/2014  . Awareness of heartbeats 10/31/2014  . Pleural cavity effusion 10/31/2014  . Phlebitis after infusion 10/31/2014  . Calculus of kidney 10/31/2014  . CD (contact dermatitis) 02/01/2008  . Excessive urination at night 05/14/2007  . Can't get food down 04/11/2006   Social History  Substance Use Topics  . Smoking status: Former Smoker    Types: Cigarettes  . Smokeless tobacco: Not on file     Comment: Quit in 1989  . Alcohol Use: 0.0 oz/week    0 Standard drinks or equivalent per week     Comment: occasionally drinks wine or beer    Current outpatient prescriptions:  .  Apixaban (ELIQUIS PO), Take by mouth., Disp: , Rfl:  .  aspirin EC 81 MG tablet, Take 81 mg by mouth every evening., Disp: , Rfl:  .  B-Complex TABS, Take 1 tablet by mouth every morning., Disp: , Rfl:  .  Cholecalciferol (VITAMIN D3) 2000 UNITS TABS, Take 1 tablet by mouth every morning., Disp: , Rfl:  .   finasteride (PROSCAR) 5 MG tablet, Take 5 mg by mouth every morning., Disp: , Rfl:  .  Magnesium 250 MG TABS, Take 1 tablet by mouth every morning., Disp: , Rfl:  .  Melatonin 1 MG TABS, Take 0.25 tablets by mouth as needed., Disp: , Rfl:  .  Multiple Vitamins-Minerals (MULTIVITAMIN PO), Take 1 tablet by mouth every morning., Disp: , Rfl:  .  tamsulosin (FLOMAX) 0.4 MG CAPS capsule, Take 0.4 mg by mouth daily after supper., Disp: , Rfl:  .  TURMERIC PO, Take 1 capsule by mouth every morning., Disp: , Rfl:   No Known Allergies  Review of Systems  Constitutional: Negative.   HENT: Negative.   Eyes: Negative.   Cardiovascular: Negative.   Gastrointestinal: Positive for abdominal pain.  Genitourinary: Negative.   Musculoskeletal: Negative.   Skin: Negative.   Neurological: Negative.   Endo/Heme/Allergies: Negative.   Psychiatric/Behavioral: Negative.    Objective  Filed Vitals:   03/27/15 1623  BP: 118/78  Pulse: 72  Temp: 97.7 F (36.5 C)  TempSrc: Oral  Resp: 16  Weight: 186 lb (84.369 kg)   Physical Exam  Constitutional: He is oriented to person, place, and time and well-developed, well-nourished, and in no distress.  HENT:  Head: Normocephalic.  Nose: Nose normal.  Eyes: Conjunctivae and EOM are normal.  Neck: Normal range of motion. Neck supple.  Cardiovascular: Normal rate and regular rhythm.  Pulmonary/Chest: Effort normal and breath sounds normal.  Abdominal: Soft. Bowel sounds are normal. There is tenderness.  Slight soreness behind umbilicus with large 2.5 cm defect. No incarcerated tissue or bulging.   Musculoskeletal: Normal range of motion.  Neurological: He is alert and oriented to person, place, and time.  Psychiatric: Memory, affect and judgment normal.   Recent Results (from the past 2160 hour(s))  CBC with Differential/Platelet     Status: Abnormal   Collection Time: 01/05/15 12:19 PM  Result Value Ref Range   WBC 7.3 3.4 - 10.8 x10E3/uL   RBC 5.59  4.14 - 5.80 x10E6/uL   Hemoglobin 17.2 12.6 - 17.7 g/dL   Hematocrit 49.4 37.5 - 51.0 %   MCV 88 79 - 97 fL   MCH 30.8 26.6 - 33.0 pg   MCHC 34.8 31.5 - 35.7 g/dL   RDW 14.4 12.3 - 15.4 %   Platelets 276 150 - 379 x10E3/uL   Neutrophils 52 %   Lymphs 27 %   Monocytes 14 %   Eos 6 %   Basos 1 %   Neutrophils Absolute 3.7 1.4 - 7.0 x10E3/uL   Lymphocytes Absolute 2.0 0.7 - 3.1 x10E3/uL   Monocytes Absolute 1.0 (H) 0.1 - 0.9 x10E3/uL   EOS (ABSOLUTE) 0.4 0.0 - 0.4 x10E3/uL   Basophils Absolute 0.1 0.0 - 0.2 x10E3/uL   Immature Granulocytes 0 %   Immature Grans (Abs) 0.0 0.0 - 0.1 x10E3/uL  Comprehensive metabolic panel     Status: Abnormal   Collection Time: 01/05/15 12:19 PM  Result Value Ref Range   Glucose 114 (H) 65 - 99 mg/dL   BUN 18 8 - 27 mg/dL   Creatinine, Ser 1.50 (H) 0.76 - 1.27 mg/dL   GFR calc non Af Amer 48 (L) >59 mL/min/1.73   GFR calc Af Amer 56 (L) >59 mL/min/1.73   BUN/Creatinine Ratio 12 10 - 22   Sodium 145 (H) 134 - 144 mmol/L   Potassium 4.7 3.5 - 5.2 mmol/L   Chloride 104 97 - 108 mmol/L   CO2 24 18 - 29 mmol/L   Calcium 10.5 (H) 8.6 - 10.2 mg/dL   Total Protein 6.6 6.0 - 8.5 g/dL   Albumin 4.3 3.6 - 4.8 g/dL   Globulin, Total 2.3 1.5 - 4.5 g/dL   Albumin/Globulin Ratio 1.9 1.1 - 2.5   Bilirubin Total 0.5 0.0 - 1.2 mg/dL   Alkaline Phosphatase 75 39 - 117 IU/L   AST 23 0 - 40 IU/L   ALT 20 0 - 44 IU/L     Assessment & Plan  1. Umbilical hernia without obstruction and without gangrene Intermittent soreness with some swelling behind the umbilicus over the past 2-3 months. No known injury. Suspect easily reducible umbilical hernia. Observe for worsening. Instructed on emergency incarceration issues. May need evaluation by surgeon if it continues to recur.  2. 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 No recurrence of chest pain or dyspnea. Tolerating Eliquis without GI bleeding.  Recommend continuing Eliquis for 6 months and recheck. - apixaban (ELIQUIS) 5 MG TABS tablet; Take 1 tablet (5 mg total) by mouth 2 (two) times daily.  Dispense: 60 tablet; Refill: 6  3. Benign fibroma of prostate Had evaluation by urology oncologist at [facility]  10-02-14 with PSA 18.4 and prostate "5x normal size" per MRI of prostate. Was placed on Proscar and Tamsulosin. Due for recheck of PSA. Continue present dosage. Pending report,  may need follow up with specialist at [facility]. - PSA

## 2015-03-30 DIAGNOSIS — N400 Enlarged prostate without lower urinary tract symptoms: Secondary | ICD-10-CM | POA: Diagnosis not present

## 2015-03-31 LAB — PSA: PROSTATE SPECIFIC AG, SERUM: 9.5 ng/mL — AB (ref 0.0–4.0)

## 2015-04-04 ENCOUNTER — Telehealth: Payer: Self-pay

## 2015-04-04 NOTE — Telephone Encounter (Signed)
-----   Message from Margo Common, [place] sent at 03/31/2015  7:58 PM EDT ----- PSA still high but down by half of the level 9 months ago. If any urinary flow issues, should follow up with the urologist. Recheck PSA in 6 months.

## 2015-04-04 NOTE — Telephone Encounter (Signed)
Pt advised-aa 

## 2015-04-04 NOTE — Telephone Encounter (Signed)
[id number] 

## 2015-04-07 ENCOUNTER — Telehealth: Payer: Self-pay | Admitting: Family Medicine

## 2015-04-21 ENCOUNTER — Other Ambulatory Visit: Payer: Self-pay | Admitting: Family Medicine

## 2015-04-21 DIAGNOSIS — R748 Abnormal levels of other serum enzymes: Secondary | ICD-10-CM | POA: Diagnosis not present

## 2015-04-21 DIAGNOSIS — R7989 Other specified abnormal findings of blood chemistry: Secondary | ICD-10-CM

## 2015-04-22 LAB — COMPREHENSIVE METABOLIC PANEL
ALK PHOS: 70 IU/L (ref 39–117)
ALT: 17 IU/L (ref 0–44)
AST: 27 IU/L (ref 0–40)
Albumin/Globulin Ratio: 2.1 (ref 1.1–2.5)
Albumin: 4.1 g/dL (ref 3.6–4.8)
BUN/Creatinine Ratio: 12 (ref 10–22)
BUN: 16 mg/dL (ref 8–27)
Bilirubin Total: 0.4 mg/dL (ref 0.0–1.2)
CO2: 24 mmol/L (ref 18–29)
CREATININE: 1.39 mg/dL — AB (ref 0.76–1.27)
Calcium: 9.6 mg/dL (ref 8.6–10.2)
Chloride: 103 mmol/L (ref 97–106)
GFR calc Af Amer: 61 mL/min/{1.73_m2} (ref >59)
GFR calc non Af Amer: 52 mL/min/{1.73_m2} — ABNORMAL LOW (ref >59)
Globulin, Total: 2 g/dL (ref 1.5–4.5)
Glucose: 101 mg/dL — ABNORMAL HIGH (ref 65–99)
POTASSIUM: 4.4 mmol/L (ref 3.5–5.2)
SODIUM: 141 mmol/L (ref 136–144)
Total Protein: 6.1 g/dL (ref 6.0–8.5)

## 2015-04-22 LAB — CBC WITH DIFFERENTIAL/PLATELET
BASOS ABS: 0 10*3/uL (ref 0.0–0.2)
Basos: 0 %
EOS (ABSOLUTE): 0.3 10*3/uL (ref 0.0–0.4)
Eos: 3 %
Hematocrit: 49.1 % (ref 37.5–51.0)
Hemoglobin: 17 g/dL (ref 12.6–17.7)
Immature Grans (Abs): 0 10*3/uL (ref 0.0–0.1)
Immature Granulocytes: 0 %
LYMPHS ABS: 2.1 10*3/uL (ref 0.7–3.1)
Lymphs: 24 %
MCH: 31.4 pg (ref 26.6–33.0)
MCHC: 34.6 g/dL (ref 31.5–35.7)
MCV: 91 fL (ref 79–97)
MONOS ABS: 0.9 10*3/uL (ref 0.1–0.9)
Monocytes: 11 %
NEUTROS PCT: 62 %
Neutrophils Absolute: 5.5 10*3/uL (ref 1.4–7.0)
Platelets: 237 10*3/uL (ref 150–379)
RBC: 5.41 x10E6/uL (ref 4.14–5.80)
RDW: 14.3 % (ref 12.3–15.4)
WBC: 8.8 10*3/uL (ref 3.4–10.8)

## 2015-04-25 ENCOUNTER — Telehealth: Payer: Self-pay

## 2015-04-25 NOTE — Telephone Encounter (Signed)
Patient advised as directed below. Patient verbalized understanding and agrees with treatment plan. 

## 2015-04-25 NOTE — Telephone Encounter (Signed)
-----   Message from Margo Common, [place] sent at 04/24/2015  5:53 PM EST ----- Normal blood cell counts. Kidney function tests improving and getting closer to normal range again. Drink plenty of fluids (water) and recheck again in 3 months.

## 2015-05-26 ENCOUNTER — Ambulatory Visit (INDEPENDENT_AMBULATORY_CARE_PROVIDER_SITE_OTHER): Payer: Medicare Other | Admitting: Family Medicine

## 2015-05-26 ENCOUNTER — Encounter: Payer: Self-pay | Admitting: Family Medicine

## 2015-05-26 VITALS — BP 120/76 | HR 64 | Temp 97.6°F | Resp 14 | Wt 191.8 lb

## 2015-05-26 DIAGNOSIS — N63 Unspecified lump in breast: Secondary | ICD-10-CM

## 2015-05-26 DIAGNOSIS — N6310 Unspecified lump in the right breast, unspecified quadrant: Secondary | ICD-10-CM

## 2015-05-26 NOTE — Progress Notes (Signed)
Patient ID: Jacob Patterson, male   DOB: [birth year], 64 y.o.   MRN: [id number]   Patient: Jacob Patterson Male    DOB: 1950/09/25   64 y.o.   MRN: [id number] Visit Date: 05/26/2015  Today's Provider: Vernie Murders, PA   Chief Complaint  Patient presents with  . Breast Pain    right breast X 1 week   Subjective:    HPI  Breast Pain: Patient reports right breast pain at the nipple area X 1 week. No known injury or nipple discharge. No local lymphadenopathy. Paternal grandmother had a history of breast cancer.     Patient Active Problem List   Diagnosis Date Noted  . Elevated prostate specific antigen (PSA) 03/27/2015  . Hx of pulmonary infarction 03/27/2015  . Arthritis 10/31/2014  . 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 D-dimer, elevated 10/31/2014  . Abnormal prostate specific antigen 10/31/2014  . Acid reflux 10/31/2014  . Hypercholesteremia 10/31/2014  . Hernia, inguinal 10/31/2014  . Arthralgia of hip 10/31/2014  . Awareness of heartbeats 10/31/2014  . Pleural cavity effusion 10/31/2014  . Phlebitis after infusion 10/31/2014  . Calculus of kidney 10/31/2014  . CD (contact dermatitis) 02/01/2008  . Excessive urination at night 05/14/2007  . Can't get food down 04/11/2006   Past Surgical History  Procedure Laterality Date  . Tonsillectomy and adenoidectomy  1960  . Colonoscopy  2008   No Known Allergies  Previous Medications   APIXABAN (ELIQUIS) 5 MG TABS TABLET    Take 1 tablet (5 mg total) by mouth 2 (two) times daily.   B-COMPLEX TABS    Take 1 tablet by mouth every morning.   CHOLECALCIFEROL (VITAMIN D3) 2000 UNITS TABS    Take 1 tablet by mouth every morning.   FINASTERIDE (PROSCAR) 5 MG TABLET    Take 5 mg by mouth every morning.   MAGNESIUM 250 MG TABS    Take 1 tablet by mouth every morning.   MELATONIN 1 MG TABS    Take 0.25 tablets by mouth as needed.   MULTIPLE VITAMINS-MINERALS (MULTIVITAMIN PO)    Take 1 tablet by mouth every morning.   TAMSULOSIN  (FLOMAX) 0.4 MG CAPS CAPSULE    Take 0.4 mg by mouth daily after supper.   TURMERIC PO    Take 1 capsule by mouth every morning.    Review of Systems  Constitutional: Negative.   HENT: Negative.   Eyes: Negative.   Respiratory: Negative.   Cardiovascular: Negative.   Gastrointestinal: Negative.   Endocrine: Negative.   Genitourinary: Negative.   Musculoskeletal: Positive for myalgias.  Allergic/Immunologic: Negative.   Neurological: Negative.   Hematological: Negative.   Psychiatric/Behavioral: Negative.     Social History  Substance Use Topics  . Smoking status: Former Smoker    Types: Cigarettes  . Smokeless tobacco: Not on file     Comment: Quit in 1989  . Alcohol Use: 0.0 oz/week    0 Standard drinks or equivalent per week     Comment: occasionally drinks wine or beer   Objective:   BP 120/76 mmHg  Pulse 64  Temp(Src) 97.6 F (36.4 C) (Oral)  Resp 14  Wt 191 lb 12.8 oz (87 kg)  Physical Exam  Constitutional: He is oriented to person, place, and time. He appears well-developed and well-nourished.  HENT:  Head: Normocephalic.  Cardiovascular: Normal rate and normal heart sounds.   Pulmonary/Chest: Breath sounds normal. He exhibits tenderness.  Soft tender cystic lesion behind right nipple. No  local lymphadenopathy.  Neurological: He is alert and oriented to person, place, and time.  Skin: No rash noted.  Psychiatric: He has a normal mood and affect. His behavior is normal.      Assessment & Plan:     1. Breast mass, right Onset over the past week with tenderness in a lump behind the right nipple. Decrease caffeine intake and may use Tylenol prn. Schedule mammogram and may need ultrasound pending report. - [facility] Digital Diagnostic Unilat R

## 2015-06-08 DIAGNOSIS — Z23 Encounter for immunization: Secondary | ICD-10-CM | POA: Diagnosis not present

## 2015-06-23 ENCOUNTER — Telehealth: Payer: Self-pay

## 2015-06-23 DIAGNOSIS — N6310 Unspecified lump in the right breast, unspecified quadrant: Secondary | ICD-10-CM

## 2015-06-23 NOTE — Telephone Encounter (Signed)
Patient states he never received a call for the appointment to have the mammogram (may have been ordered as a screening, which patient calls to schedule appointment. Diagnostic mammo Judson Roch calls) . Patient reports mass is not bothering him any longer. Advised patient that Simona Huh may still want to do a mammogram to rule out any serious complications. Patient agreed. Please advise if you would like screening or diagnostic mammogram.

## 2015-06-23 NOTE — Telephone Encounter (Signed)
Placed order for diagnostic mammogram of right breast mass. Please schedule and advise patient when and where to get it done.

## 2015-06-26 NOTE — Telephone Encounter (Signed)
[facility] would not take order that was in EPIC.She states they will need an order for diagnostic bilateral mammogram,ultrasound of right/left breast and clock position of breast mass

## 2015-06-28 ENCOUNTER — Other Ambulatory Visit: Payer: Self-pay | Admitting: Family Medicine

## 2015-06-28 NOTE — Telephone Encounter (Signed)
Please find and order bilateral mammogram and bilateral ultrasound of breasts needed to evaluate a mass (behind the right nipple of a MALE patient) that will meet all the criteria radiology needs. Thanks!

## 2015-06-29 NOTE — Addendum Note (Signed)
Addended by: Jules Schick on: 06/29/2015 12:46 PM   Modules accepted: Orders

## 2015-06-29 NOTE — Telephone Encounter (Signed)
Ordered bilateral mammo and bilateral US.

## 2015-07-05 ENCOUNTER — Ambulatory Visit
Admission: RE | Admit: 2015-07-05 | Discharge: 2015-07-05 | Disposition: A | Discharge status: Home / Self Care | Payer: Medicare Other | Source: Ambulatory Visit | Attending: Family Medicine | Admitting: Family Medicine

## 2015-07-05 ENCOUNTER — Ambulatory Visit: Admission: RE | Admit: 2015-07-05 | Payer: Medicare Other | Source: Ambulatory Visit

## 2015-07-05 ENCOUNTER — Other Ambulatory Visit: Payer: Self-pay | Admitting: Family Medicine

## 2015-07-05 DIAGNOSIS — R928 Other abnormal and inconclusive findings on diagnostic imaging of breast: Secondary | ICD-10-CM | POA: Diagnosis not present

## 2015-07-05 DIAGNOSIS — N63 Unspecified lump in breast: Secondary | ICD-10-CM | POA: Diagnosis present

## 2015-07-05 DIAGNOSIS — N6310 Unspecified lump in the right breast, unspecified quadrant: Secondary | ICD-10-CM

## 2015-07-05 DIAGNOSIS — N62 Hypertrophy of breast: Secondary | ICD-10-CM | POA: Insufficient documentation

## 2015-07-10 ENCOUNTER — Telehealth: Payer: Self-pay

## 2015-07-10 NOTE — Telephone Encounter (Signed)
-----   Message from Margo Common, [place] sent at 07/07/2015  6:24 PM EST ----- No sign of breast cancer. Radiologist stated they would send you a letter regarding results and recommendations.

## 2015-07-10 NOTE — Telephone Encounter (Signed)
Patient advised as directed below. Patient verbalized understanding.  

## 2015-07-10 NOTE — Telephone Encounter (Signed)
LMTCB

## 2015-07-11 ENCOUNTER — Other Ambulatory Visit: Payer: Self-pay | Admitting: Family Medicine

## 2015-07-11 DIAGNOSIS — Z8709 Personal history of other diseases of the respiratory system: Secondary | ICD-10-CM

## 2015-07-11 MED ORDER — APIXABAN 5 MG PO TABS: 5.0000 mg | ORAL_TABLET | Freq: Two times a day (BID) | ORAL | Status: DC

## 2015-07-11 NOTE — Telephone Encounter (Signed)
Advise patient new recommendations indicate need for recheck of creatinine/kidney function on when on the Eliquis. May need dose adjustment. Please schedule follow up. Will make a month supply available at the [facility].

## 2015-07-11 NOTE — Telephone Encounter (Signed)
Pt contacted office for refill request on the following medications:  apixaban (ELIQUIS) 5 MG TABS tablet.  [place].  CB#[phone]/MW

## 2015-07-12 NOTE — Telephone Encounter (Signed)
Patient advised as directed below. Patient verbalized understanding and agrees with plan of care. Patient states he will call back to schedule a follow up appointment.

## 2015-07-14 ENCOUNTER — Encounter: Payer: Self-pay | Admitting: Family Medicine

## 2015-07-14 ENCOUNTER — Ambulatory Visit (INDEPENDENT_AMBULATORY_CARE_PROVIDER_SITE_OTHER): Payer: Medicare Other | Admitting: Family Medicine

## 2015-07-14 ENCOUNTER — Other Ambulatory Visit: Payer: Self-pay | Admitting: Family Medicine

## 2015-07-14 VITALS — BP 110/78 | HR 78 | Temp 97.5°F | Resp 16 | Wt 194.2 lb

## 2015-07-14 DIAGNOSIS — R748 Abnormal levels of other serum enzymes: Secondary | ICD-10-CM | POA: Diagnosis not present

## 2015-07-14 DIAGNOSIS — E7800 Pure hypercholesterolemia, unspecified: Secondary | ICD-10-CM

## 2015-07-14 DIAGNOSIS — Z8709 Personal history of other diseases of the respiratory system: Secondary | ICD-10-CM | POA: Diagnosis not present

## 2015-07-14 DIAGNOSIS — R7989 Other specified abnormal findings of blood chemistry: Secondary | ICD-10-CM

## 2015-07-14 NOTE — Progress Notes (Signed)
Patient ID: Jacob Patterson, male   DOB: 12/29/1950, 65 y.o.   MRN: [id number]   Patient: Jacob Patterson Male    DOB: 19-May-1951   65 y.o.   MRN: [id number] Visit Date: 07/14/2015  Today's Provider: Vernie Murders, PA   Chief Complaint  Patient presents with  . Follow-up   Subjective:    HPI Patient is following up for labs to check to see if Eliquis dosage needs to be adjusted. Patient is also requesting a refill on Eliquis.     Patient Active Problem List   Diagnosis Date Noted  . Elevated prostate specific antigen (PSA) 03/27/2015  . Hx of pulmonary infarction 03/27/2015  . Arthritis 10/31/2014  . 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 D-dimer, elevated 10/31/2014  . Abnormal prostate specific antigen 10/31/2014  . Acid reflux 10/31/2014  . Hypercholesteremia 10/31/2014  . Hernia, inguinal 10/31/2014  . Arthralgia of hip 10/31/2014  . Awareness of heartbeats 10/31/2014  . Pleural cavity effusion 10/31/2014  . Phlebitis after infusion 10/31/2014  . Calculus of kidney 10/31/2014  . CD (contact dermatitis) 02/01/2008  . Excessive urination at night 05/14/2007  . Can't get food down 04/11/2006   Past Surgical History  Procedure Laterality Date  . Tonsillectomy and adenoidectomy  1960  . Colonoscopy  2008   Family History  Problem Relation Age of Onset  . Arthritis Mother   . Stroke Mother   . Arthritis Father   . Dementia Father   . Cataracts Father   . Hyperlipidemia Sister   . Breast cancer Paternal Grandmother   . Heart attack Paternal Grandfather     Previous Medications   APIXABAN (ELIQUIS) 5 MG TABS TABLET    Take 1 tablet (5 mg total) by mouth 2 (two) times daily.   B-COMPLEX TABS    Take 1 tablet by mouth every morning.   CHOLECALCIFEROL (VITAMIN D3) 2000 UNITS TABS    Take 1 tablet by mouth every morning.   FINASTERIDE (PROSCAR) 5 MG TABLET    Take 5 mg by mouth every morning.   MAGNESIUM 250 MG TABS    Take 1 tablet by mouth every morning.   MELATONIN  1 MG TABS    Take 0.25 tablets by mouth as needed.   MULTIPLE VITAMINS-MINERALS (MULTIVITAMIN PO)    Take 1 tablet by mouth every morning.   TAMSULOSIN (FLOMAX) 0.4 MG CAPS CAPSULE    Take 0.4 mg by mouth daily after supper.   TURMERIC PO    Take 1 capsule by mouth every morning.   No Known Allergies  Review of Systems  Constitutional: Negative.   HENT: Negative.   Eyes: Negative.   Respiratory: Negative.   Cardiovascular: Negative.   Gastrointestinal: Negative.   Endocrine: Negative.   Genitourinary: Negative.   Musculoskeletal: Negative.   Skin: Negative.   Allergic/Immunologic: Negative.   Neurological: Negative.   Hematological: Negative.   Psychiatric/Behavioral: Negative.     Social History  Substance Use Topics  . Smoking status: Former Smoker    Types: Cigarettes  . Smokeless tobacco: Not on file     Comment: Quit in 1989  . Alcohol Use: 0.0 oz/week    0 Standard drinks or equivalent per week     Comment: occasionally drinks wine or beer   Objective:   BP 110/78 mmHg  Pulse 78  Temp(Src) 97.5 F (36.4 C) (Oral)  Resp 16  Wt 194 lb 3.2 oz (88.089 kg) Wt Readings from Last 3 Encounters:  07/14/15  194 lb 3.2 oz (88.089 kg)  05/26/15 191 lb 12.8 oz (87 kg)  03/27/15 186 lb (84.369 kg)    Physical Exam  Constitutional: He is oriented to person, place, and time. He appears well-developed and well-nourished. No distress.  HENT:  Head: Normocephalic and atraumatic.  Right Ear: Hearing normal.  Left Ear: Hearing normal.  Nose: Nose normal.  Eyes: Conjunctivae and lids are normal. Right eye exhibits no discharge. Left eye exhibits no discharge. No scleral icterus.  Neck: Neck supple.  Cardiovascular: Normal rate and regular rhythm.   Pulmonary/Chest: Effort normal and breath sounds normal. No respiratory distress.  Abdominal: Soft. Bowel sounds are normal.  Musculoskeletal: Normal range of motion.  Neurological: He is alert and oriented to person, place, and  time.  Skin: Skin is intact. No lesion and no rash noted.  Psychiatric: He has a normal mood and affect. His speech is normal and behavior is normal. Thought content normal.      Assessment & Plan:     1. Hx of pulmonary infarction Feeling well. No dyspnea or chest pain. Continue to take Eliquis 5 mg BID and recheck labs.  2. Elevated serum creatinine Last creatinine was 1.39 on 04-21-15. This was an improvement from previous readings. No return of kidney stone symptoms. Will recheck labs and consider Eliquis dosage adjustment is still elevated. - CBC with Differential/Platelet - COMPLETE METABOLIC PANEL WITH GFR  3. Hypercholesteremia Continues to work on low fat diet. Has gained 3 lbs since last weight check. Admits to dietary indiscretions during a recent trip to [place] for a family member's wedding. Recheck lipids and encouraged to exercise more. Recheck pending lab reports. - Lipid panel

## 2015-07-15 LAB — CBC WITH DIFFERENTIAL/PLATELET
BASOS: 0 %
Basophils Absolute: 0 10*3/uL (ref 0.0–0.2)
EOS (ABSOLUTE): 0.1 10*3/uL (ref 0.0–0.4)
Eos: 2 %
Hematocrit: 49.8 % (ref 37.5–51.0)
Hemoglobin: 17.6 g/dL (ref 12.6–17.7)
IMMATURE GRANS (ABS): 0 10*3/uL (ref 0.0–0.1)
IMMATURE GRANULOCYTES: 0 %
LYMPHS: 25 %
Lymphocytes Absolute: 1.9 10*3/uL (ref 0.7–3.1)
MCH: 32.4 pg (ref 26.6–33.0)
MCHC: 35.3 g/dL (ref 31.5–35.7)
MCV: 92 fL (ref 79–97)
MONOS ABS: 0.9 10*3/uL (ref 0.1–0.9)
Monocytes: 12 %
NEUTROS PCT: 61 %
Neutrophils Absolute: 4.6 10*3/uL (ref 1.4–7.0)
PLATELETS: 221 10*3/uL (ref 150–379)
RBC: 5.43 x10E6/uL (ref 4.14–5.80)
RDW: 13.4 % (ref 12.3–15.4)
WBC: 7.5 10*3/uL (ref 3.4–10.8)

## 2015-07-15 LAB — COMPREHENSIVE METABOLIC PANEL
ALT: 18 IU/L (ref 0–44)
AST: 24 IU/L (ref 0–40)
Albumin/Globulin Ratio: 1.8 (ref 1.1–2.5)
Albumin: 3.9 g/dL (ref 3.6–4.8)
Alkaline Phosphatase: 74 IU/L (ref 39–117)
BILIRUBIN TOTAL: 0.4 mg/dL (ref 0.0–1.2)
BUN/Creatinine Ratio: 16 (ref 10–22)
BUN: 24 mg/dL (ref 8–27)
CALCIUM: 9.8 mg/dL (ref 8.6–10.2)
CO2: 23 mmol/L (ref 18–29)
Chloride: 103 mmol/L (ref 96–106)
Creatinine, Ser: 1.53 mg/dL — ABNORMAL HIGH (ref 0.76–1.27)
GFR, EST AFRICAN AMERICAN: 54 mL/min/{1.73_m2} — AB (ref >59)
GFR, EST NON AFRICAN AMERICAN: 47 mL/min/{1.73_m2} — AB (ref >59)
GLOBULIN, TOTAL: 2.2 g/dL (ref 1.5–4.5)
Glucose: 108 mg/dL — ABNORMAL HIGH (ref 65–99)
POTASSIUM: 4.4 mmol/L (ref 3.5–5.2)
SODIUM: 142 mmol/L (ref 134–144)
TOTAL PROTEIN: 6.1 g/dL (ref 6.0–8.5)

## 2015-07-15 LAB — LIPID PANEL
CHOLESTEROL TOTAL: 208 mg/dL — AB (ref 100–199)
Chol/HDL Ratio: 3.3 ratio units (ref 0.0–5.0)
HDL: 64 mg/dL (ref >39)
LDL CALC: 130 mg/dL — AB (ref 0–99)
Triglycerides: 68 mg/dL (ref 0–149)
VLDL CHOLESTEROL CAL: 14 mg/dL (ref 5–40)

## 2015-07-17 ENCOUNTER — Telehealth: Payer: Self-pay

## 2015-07-17 NOTE — Telephone Encounter (Signed)
-----   Message from Margo Common, [place] sent at 07/17/2015 12:43 AM EST ----- Kidney function tests show strain again. Remainder of tests normal. Decrease Eliquis to 5 mg daily and recheck blood tests in 1 month.

## 2015-07-17 NOTE — Telephone Encounter (Signed)
Patient advised as directed below. Patient verbalized understanding. Patient states he will call back to schedule a follow up appointment.  

## 2015-08-10 ENCOUNTER — Encounter: Payer: Self-pay | Admitting: Family Medicine

## 2015-08-10 ENCOUNTER — Ambulatory Visit (INDEPENDENT_AMBULATORY_CARE_PROVIDER_SITE_OTHER): Payer: Medicare Other | Admitting: Family Medicine

## 2015-08-10 VITALS — BP 112/74 | HR 80 | Temp 97.8°F | Resp 16 | Wt 192.0 lb

## 2015-08-10 DIAGNOSIS — Z8709 Personal history of other diseases of the respiratory system: Secondary | ICD-10-CM | POA: Diagnosis not present

## 2015-08-10 DIAGNOSIS — R748 Abnormal levels of other serum enzymes: Secondary | ICD-10-CM

## 2015-08-10 DIAGNOSIS — R7989 Other specified abnormal findings of blood chemistry: Secondary | ICD-10-CM

## 2015-08-10 NOTE — Progress Notes (Signed)
Subjective:    Patient ID: Jacob Patterson, male    DOB: [birth year], 65 y.o.   MRN: [id number] Chief Complaint  Patient presents with  . 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 Had elevation of creatinine to 1.53 on 07-14-15 suspected secondary to Eliquis. Decreased dosage form 5 mg BID to one daily. Has been increasing fluid intake and walking some for exercise. Denies chest pains, dyspnea, diaphoresis or palpitations. No epistaxis, hematuria, dyspepsia, melena, bruising or gums bleeding.    Patient Active Problem List   Diagnosis Date Noted  . Elevated prostate specific antigen (PSA) 03/27/2015  . Hx of pulmonary infarction 03/27/2015  . Arthritis 10/31/2014  . 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 D-dimer, elevated 10/31/2014  . Abnormal prostate specific antigen 10/31/2014  . Acid reflux 10/31/2014  . Hypercholesteremia 10/31/2014  . Hernia, inguinal 10/31/2014  . Arthralgia of hip 10/31/2014  . Awareness of heartbeats 10/31/2014  . Pleural cavity effusion 10/31/2014  . Phlebitis after infusion 10/31/2014  . Calculus of kidney 10/31/2014  . CD (contact dermatitis) 02/01/2008  . Excessive urination at night 05/14/2007  . Can't get food down 04/11/2006   Past Surgical History  Procedure Laterality Date  . Tonsillectomy and adenoidectomy  1960  . Colonoscopy  2008   Family History  Problem Relation Age of Onset  . Arthritis Mother   . Stroke Mother   . Arthritis Father   . Dementia Father   . Cataracts Father   . Hyperlipidemia Sister   . Breast cancer Paternal Grandmother   . Heart attack Paternal Grandfather    No Known Allergies   Current Outpatient Prescriptions on File Prior to Visit  Medication Sig Dispense Refill  . apixaban (ELIQUIS) 5 MG TABS tablet Take 1 tablet (5 mg total) by mouth 2 (two) times daily. 60 tablet 0  . B-Complex TABS Take 1 tablet by mouth every morning.     . Cholecalciferol (VITAMIN D3) 2000 UNITS TABS Take 1 tablet by mouth every morning.    . finasteride (PROSCAR) 5 MG tablet Take 5 mg by mouth every morning.    . Magnesium 250 MG TABS Take 1 tablet by mouth every morning.    . Melatonin 1 MG TABS Take 0.25 tablets by mouth as needed.    . Multiple Vitamins-Minerals (MULTIVITAMIN PO) Take 1 tablet by mouth every morning.    . tamsulosin (FLOMAX) 0.4 MG CAPS capsule Take 0.4 mg by mouth daily after supper.    . TURMERIC PO Take 1 capsule by mouth every morning.     No current facility-administered medications on file prior to visit.    Review of Systems  Constitutional: Negative.   HENT: Negative.   Respiratory: Negative.   Cardiovascular: Negative.   Gastrointestinal: Negative.   Genitourinary: Negative.        Nocturia 2 times a night.  Musculoskeletal: Negative.   Hematological: Negative.    Social History  Substance Use Topics  . Smoking status: Former Smoker    Types: Cigarettes  . Smokeless tobacco: None     Comment: Quit in 1989  . Alcohol Use: 0.0 oz/week    0 Standard drinks or equivalent per week     Comment: occasionally drinks wine or beer    BP 112/74 mmHg  Pulse 80  Temp(Src) 97.8 F (36.6 C)  Resp 16  Wt 192 lb (87.091 kg) Wt Readings from  Last 3 Encounters:  08/10/15 192 lb (87.091 kg)  07/14/15 194 lb 3.2 oz (88.089 kg)  05/26/15 191 lb 12.8 oz (87 kg)       Objective:   Physical Exam  Constitutional: He is oriented to person, place, and time. He appears well-developed and well-nourished. No distress.  HENT:  Head: Normocephalic and atraumatic.  Right Ear: Hearing and external ear normal.  Left Ear: Hearing and external ear normal.  Nose: Nose normal.  Eyes: Conjunctivae, EOM and lids are normal. Right eye exhibits no discharge. Left eye exhibits no discharge. No scleral icterus.  Neck: Normal range of motion. Neck supple.  Cardiovascular: Normal rate, regular rhythm and normal heart sounds.    Pulmonary/Chest: Effort normal and breath sounds normal. No respiratory distress.  Abdominal: Soft. Bowel sounds are normal.  Musculoskeletal: Normal range of motion.  Neurological: He is alert and oriented to person, place, and time.  Skin: Skin is intact. No lesion and no rash noted.  Psychiatric: He has a normal mood and affect. His speech is normal and behavior is normal. Thought content normal.      Assessment & Plan:  1. Elevated serum creatinine Decreased Eliquis 07-14-15 due to creatinine of 1.53. Recheck labs and follow up pending reports. - CBC with Differential/Platelet - Comprehensive metabolic panel  2. Hx of pulmonary infarction No recurrence or residual chest pains. No dyspnea or palpitations. Continue Eliquis to complete 1 year as of 10-13-15. Probably stop at that point and switch to ASA 81 mg qd. Recheck pending lab reports.

## 2015-08-11 DIAGNOSIS — R748 Abnormal levels of other serum enzymes: Secondary | ICD-10-CM | POA: Diagnosis not present

## 2015-08-12 LAB — CBC WITH DIFFERENTIAL/PLATELET
BASOS ABS: 0 10*3/uL (ref 0.0–0.2)
Basos: 0 %
EOS (ABSOLUTE): 0.2 10*3/uL (ref 0.0–0.4)
Eos: 3 %
HEMOGLOBIN: 17.2 g/dL (ref 12.6–17.7)
Hematocrit: 49.4 % (ref 37.5–51.0)
IMMATURE GRANS (ABS): 0 10*3/uL (ref 0.0–0.1)
IMMATURE GRANULOCYTES: 0 %
LYMPHS: 30 %
Lymphocytes Absolute: 2.1 10*3/uL (ref 0.7–3.1)
MCH: 31.9 pg (ref 26.6–33.0)
MCHC: 34.8 g/dL (ref 31.5–35.7)
MCV: 92 fL (ref 79–97)
MONOCYTES: 13 %
Monocytes Absolute: 0.9 10*3/uL (ref 0.1–0.9)
NEUTROS ABS: 3.7 10*3/uL (ref 1.4–7.0)
NEUTROS PCT: 54 %
PLATELETS: 242 10*3/uL (ref 150–379)
RBC: 5.4 x10E6/uL (ref 4.14–5.80)
RDW: 13.6 % (ref 12.3–15.4)
WBC: 6.9 10*3/uL (ref 3.4–10.8)

## 2015-08-12 LAB — COMPREHENSIVE METABOLIC PANEL
ALBUMIN: 4 g/dL (ref 3.6–4.8)
ALT: 16 IU/L (ref 0–44)
AST: 25 IU/L (ref 0–40)
Albumin/Globulin Ratio: 2 (ref 1.1–2.5)
Alkaline Phosphatase: 67 IU/L (ref 39–117)
BILIRUBIN TOTAL: 0.4 mg/dL (ref 0.0–1.2)
BUN / CREAT RATIO: 17 (ref 10–22)
BUN: 24 mg/dL (ref 8–27)
CALCIUM: 9.7 mg/dL (ref 8.6–10.2)
CHLORIDE: 103 mmol/L (ref 96–106)
CO2: 21 mmol/L (ref 18–29)
CREATININE: 1.4 mg/dL — AB (ref 0.76–1.27)
GFR calc non Af Amer: 52 mL/min/{1.73_m2} — ABNORMAL LOW (ref >59)
GFR, EST AFRICAN AMERICAN: 60 mL/min/{1.73_m2} (ref >59)
GLUCOSE: 106 mg/dL — AB (ref 65–99)
Globulin, Total: 2 g/dL (ref 1.5–4.5)
Potassium: 4.3 mmol/L (ref 3.5–5.2)
Sodium: 142 mmol/L (ref 134–144)
TOTAL PROTEIN: 6 g/dL (ref 6.0–8.5)

## 2015-08-14 ENCOUNTER — Telehealth: Payer: Self-pay

## 2015-08-14 NOTE — Telephone Encounter (Signed)
-----   Message from Margo Common, [place] sent at 08/12/2015 10:53 PM EST ----- Blood counts are normal - no anemia. Kidney function improving with decrease in Eliquis dosage - almost normal now. Continue present medications and recheck in 3 months.

## 2015-08-14 NOTE — Telephone Encounter (Signed)
LMTCB

## 2015-08-21 NOTE — Telephone Encounter (Signed)
Patient advised as directed below. Patient verbalized understanding.  

## 2015-09-20 ENCOUNTER — Other Ambulatory Visit: Payer: Self-pay | Admitting: Family Medicine

## 2015-09-20 DIAGNOSIS — Z8709 Personal history of other diseases of the respiratory system: Secondary | ICD-10-CM

## 2015-09-20 NOTE — Telephone Encounter (Signed)
Pt contacted office for refill request on the following medications:  [facility].  CB#[phone]/MW  tamsulosin (FLOMAX) 0.4 MG CAPS capsule  apixaban (ELIQUIS) 5 MG TABS tablet

## 2015-09-21 MED ORDER — TAMSULOSIN HCL 0.4 MG PO CAPS: 0.4000 mg | ORAL_CAPSULE | Freq: Every day | ORAL | Status: AC

## 2015-09-21 MED ORDER — APIXABAN 5 MG PO TABS: 5.0000 mg | ORAL_TABLET | Freq: Every day | ORAL | Status: DC

## 2015-10-25 DIAGNOSIS — N400 Enlarged prostate without lower urinary tract symptoms: Secondary | ICD-10-CM | POA: Diagnosis not present

## 2015-10-25 DIAGNOSIS — R972 Elevated prostate specific antigen [PSA]: Secondary | ICD-10-CM | POA: Diagnosis not present

## 2015-11-02 ENCOUNTER — Other Ambulatory Visit: Payer: Self-pay | Admitting: Family Medicine

## 2015-11-02 DIAGNOSIS — Z8709 Personal history of other diseases of the respiratory system: Secondary | ICD-10-CM

## 2015-11-02 NOTE — Telephone Encounter (Signed)
Pt is requesting refill on apixaban (ELIQUIS) 5 MG TABS tablet. Sent into [facility]

## 2015-11-03 NOTE — Telephone Encounter (Signed)
Please review-aa 

## 2015-11-03 NOTE — Telephone Encounter (Signed)
Advised patient he has completed a year on the Eliquis and with the adverse effect on his kidneys, will stop it and use ASA 81 mg qd for prevention. Keep appointment in 2 weeks for follow up.

## 2015-11-07 DIAGNOSIS — H2513 Age-related nuclear cataract, bilateral: Secondary | ICD-10-CM | POA: Diagnosis not present

## 2015-11-16 DIAGNOSIS — H2513 Age-related nuclear cataract, bilateral: Secondary | ICD-10-CM | POA: Diagnosis not present

## 2015-11-20 ENCOUNTER — Encounter: Payer: Self-pay | Admitting: *Deleted

## 2015-11-20 DIAGNOSIS — D2262 Melanocytic nevi of left upper limb, including shoulder: Secondary | ICD-10-CM | POA: Diagnosis not present

## 2015-11-20 DIAGNOSIS — D2272 Melanocytic nevi of left lower limb, including hip: Secondary | ICD-10-CM | POA: Diagnosis not present

## 2015-11-20 DIAGNOSIS — D225 Melanocytic nevi of trunk: Secondary | ICD-10-CM | POA: Diagnosis not present

## 2015-11-20 DIAGNOSIS — D2261 Melanocytic nevi of right upper limb, including shoulder: Secondary | ICD-10-CM | POA: Diagnosis not present

## 2015-11-21 ENCOUNTER — Encounter: Payer: Self-pay | Admitting: Family Medicine

## 2015-11-21 ENCOUNTER — Ambulatory Visit (INDEPENDENT_AMBULATORY_CARE_PROVIDER_SITE_OTHER): Payer: Medicare Other | Admitting: Family Medicine

## 2015-11-21 DIAGNOSIS — R7989 Other specified abnormal findings of blood chemistry: Secondary | ICD-10-CM

## 2015-11-21 DIAGNOSIS — R748 Abnormal levels of other serum enzymes: Secondary | ICD-10-CM

## 2015-11-21 DIAGNOSIS — Z8709 Personal history of other diseases of the respiratory system: Secondary | ICD-10-CM

## 2015-11-21 NOTE — Progress Notes (Signed)
Patient: Jacob Patterson Male    DOB: 1951-05-29   65 y.o.   MRN: [id number] Visit Date: 11/21/2015  Today's Provider: Vernie Murders, PA   Chief Complaint  Patient presents with  . Follow-up    Kidney Function   Subjective:    HPI Bud Zilinski is here for 3 month follow-up Kidney Function/Pulmonary infarction. In the last office visit patient was advised to complete the 1 year of Eliquis and start ASA 81 mg qd as of 10-13-15. No chest pain, dyspnea or leg swelling. No bruises or hemoptysis. Feeling well.  Past Medical History  Diagnosis Date  . PE (pulmonary embolism)   . Murmur   . Dizziness     medication related  . Diverticulosis   . Hiatal hernia   . GERD (gastroesophageal reflux disease)   . Environmental allergies   . HOH (hard of hearing)   . Hypercholesteremia   . Kidney stones   . Wheezing    Past Surgical History  Procedure Laterality Date  . Tonsillectomy and adenoidectomy  1960  . Colonoscopy  2008  . Tonsillectomy    . Kidney stent    . Cataract extraction w/phaco Left 11/23/2015    Procedure: CATARACT EXTRACTION PHACO AND INTRAOCULAR LENS PLACEMENT (IOC);  Surgeon: Eulogio Bear, MD;  Location: [facility] ORS;  Service: Ophthalmology;  Laterality: Left;  Lot # [id number] H [place]: 01:03.8 AP%: 19.5 CDE: 12.46    Family History  Problem Relation Age of Onset  . Arthritis Mother   . Stroke Mother   . Arthritis Father   . Dementia Father   . Cataracts Father   . Hyperlipidemia Sister   . Breast cancer Paternal Grandmother   . Heart attack Paternal Grandfather    No Known Allergies   Current Meds  Medication Sig  . aspirin 81 MG tablet Take 81 mg by mouth daily.  Marland Kitchen B-Complex TABS Take 1 tablet by mouth every morning.  . Cholecalciferol (VITAMIN D3) 2000 UNITS TABS Take 1 tablet by mouth every morning.  . finasteride (PROSCAR) 5 MG tablet Take 5 mg by mouth every morning.  . Magnesium 200 MG TABS Take 1 tablet by mouth 2 (two) times daily.  .  Melatonin-Pyridoxine (MELATONEX PO) Take 1 tablet by mouth at bedtime. 0.5  . Multiple Vitamins-Minerals (MULTIVITAMIN PO) Take 1 tablet by mouth every morning.  . niacin 500 MG tablet Take 500 mg by mouth at bedtime.  . tamsulosin (FLOMAX) 0.4 MG CAPS capsule Take 1 capsule (0.4 mg total) by mouth daily after supper.  . TURMERIC PO Take 1 capsule by mouth every morning.    Review of Systems  Constitutional: Negative.   HENT: Negative.   Eyes: Negative.        To have left cataract surgery 11-23-15.  Cardiovascular: Negative for chest pain, palpitations and leg swelling.  Gastrointestinal: Negative.   Genitourinary: Negative.     Social History  Substance Use Topics  . Smoking status: Former Smoker    Types: Cigarettes  . Smokeless tobacco: Not on file     Comment: Quit in 1989  . Alcohol Use: 0.0 oz/week    0 Standard drinks or equivalent per week     Comment: occasionally drinks wine or beer   Objective:   BP 112/60 mmHg  Pulse 72  Temp(Src) 98.2 F (36.8 C) (Oral)  Resp 16  Wt 199 lb 12.8 oz (90.629 kg) Wt Readings from Last 3 Encounters:  11/21/15 199 lb 12.8  oz (90.629 kg)  11/20/15 192 lb (87.091 kg)  08/10/15 192 lb (87.091 kg)    Physical Exam  Constitutional: He is oriented to person, place, and time. He appears well-developed and well-nourished. No distress.  HENT:  Head: Normocephalic and atraumatic.  Right Ear: Hearing normal.  Left Ear: Hearing normal.  Nose: Nose normal.  Eyes: Conjunctivae and lids are normal. Right eye exhibits no discharge. Left eye exhibits no discharge. No scleral icterus.  Cardiovascular: Normal rate and regular rhythm.   Many varicose veins in lower legs.  Pulmonary/Chest: Effort normal. No respiratory distress.  Musculoskeletal: Normal range of motion.  Neurological: He is alert and oriented to person, place, and time.  Skin: Skin is intact. No lesion and no rash noted.  Psychiatric: He has a normal mood and affect. His  speech is normal and behavior is normal. Thought content normal.      Assessment & Plan:     1. Elevated serum creatinine Concerned about elevation of creatinine to 1.53 on 07-14-15 and tapered back on Eliquis. Continued 5 mg qd until 10-13-15 (one full year of treatment for PE with infarct). Now only on ASA 81 mg qd. Asymptomatic and repeat creatinine was 1.40 on 08-11-15. Encouraged to increase water intake with diet and recheck CBC with renal function tests. Follow up pending reports. - CBC with Differential/Platelet - Renal Function Panel  2. Hx of pulmonary infarction No residual chest discomfort or dyspnea. Finished one year of Eliquis on 10-13-15. Has been on ASA 81 mg qd since that time. No bruising of significance, hemoptysis, hematuria, hematochezia or epistaxis. Recheck CBC. Follow up pending report. - CBC with Differential/Platelet       Vernie Murders, PA  [facility]

## 2015-11-22 DIAGNOSIS — R748 Abnormal levels of other serum enzymes: Secondary | ICD-10-CM | POA: Diagnosis not present

## 2015-11-22 DIAGNOSIS — Z8709 Personal history of other diseases of the respiratory system: Secondary | ICD-10-CM | POA: Diagnosis not present

## 2015-11-23 ENCOUNTER — Encounter: Payer: Self-pay | Admitting: *Deleted

## 2015-11-23 ENCOUNTER — Ambulatory Visit: Payer: Medicare Other | Admitting: Anesthesiology

## 2015-11-23 ENCOUNTER — Encounter
Admission: RE | Disposition: A | Discharge status: Home / Self Care | Payer: Self-pay | Source: Ambulatory Visit | Attending: Ophthalmology

## 2015-11-23 ENCOUNTER — Ambulatory Visit
Admission: RE | Admit: 2015-11-23 | Discharge: 2015-11-23 | Disposition: A | Discharge status: Home / Self Care | Payer: Medicare Other | Source: Ambulatory Visit | Attending: Ophthalmology | Admitting: Ophthalmology

## 2015-11-23 DIAGNOSIS — K219 Gastro-esophageal reflux disease without esophagitis: Secondary | ICD-10-CM | POA: Diagnosis not present

## 2015-11-23 DIAGNOSIS — H2512 Age-related nuclear cataract, left eye: Secondary | ICD-10-CM | POA: Diagnosis not present

## 2015-11-23 DIAGNOSIS — Z86711 Personal history of pulmonary embolism: Secondary | ICD-10-CM | POA: Diagnosis not present

## 2015-11-23 DIAGNOSIS — R011 Cardiac murmur, unspecified: Secondary | ICD-10-CM | POA: Diagnosis not present

## 2015-11-23 DIAGNOSIS — H2513 Age-related nuclear cataract, bilateral: Secondary | ICD-10-CM | POA: Diagnosis not present

## 2015-11-23 DIAGNOSIS — Z87891 Personal history of nicotine dependence: Secondary | ICD-10-CM | POA: Diagnosis not present

## 2015-11-23 DIAGNOSIS — E7800 Pure hypercholesterolemia, unspecified: Secondary | ICD-10-CM | POA: Diagnosis not present

## 2015-11-23 DIAGNOSIS — H9190 Unspecified hearing loss, unspecified ear: Secondary | ICD-10-CM | POA: Insufficient documentation

## 2015-11-23 DIAGNOSIS — Z8719 Personal history of other diseases of the digestive system: Secondary | ICD-10-CM | POA: Diagnosis not present

## 2015-11-23 DIAGNOSIS — Z87442 Personal history of urinary calculi: Secondary | ICD-10-CM | POA: Diagnosis not present

## 2015-11-23 DIAGNOSIS — K449 Diaphragmatic hernia without obstruction or gangrene: Secondary | ICD-10-CM | POA: Insufficient documentation

## 2015-11-23 HISTORY — DX: Dizziness and giddiness: R42

## 2015-11-23 HISTORY — DX: Gastro-esophageal reflux disease without esophagitis: K21.9

## 2015-11-23 HISTORY — DX: Calculus of kidney: N20.0

## 2015-11-23 HISTORY — DX: Diverticulosis of intestine, part unspecified, without perforation or abscess without bleeding: K57.90

## 2015-11-23 HISTORY — DX: Other pulmonary embolism without acute cor pulmonale: I26.99

## 2015-11-23 HISTORY — DX: Unspecified hearing loss, unspecified ear: H91.90

## 2015-11-23 HISTORY — DX: Other allergy status, other than to drugs and biological substances: Z91.09

## 2015-11-23 HISTORY — DX: Wheezing: R06.2

## 2015-11-23 HISTORY — PX: CATARACT EXTRACTION W/PHACO: SHX586

## 2015-11-23 HISTORY — DX: Diaphragmatic hernia without obstruction or gangrene: K44.9

## 2015-11-23 HISTORY — DX: Pure hypercholesterolemia, unspecified: E78.00

## 2015-11-23 HISTORY — DX: Cardiac murmur, unspecified: R01.1

## 2015-11-23 LAB — CBC WITH DIFFERENTIAL/PLATELET
BASOS: 1 %
Basophils Absolute: 0 10*3/uL (ref 0.0–0.2)
EOS (ABSOLUTE): 0.2 10*3/uL (ref 0.0–0.4)
Eos: 3 %
HEMOGLOBIN: 17.2 g/dL (ref 12.6–17.7)
Hematocrit: 49.7 % (ref 37.5–51.0)
IMMATURE GRANS (ABS): 0 10*3/uL (ref 0.0–0.1)
Immature Granulocytes: 0 %
LYMPHS ABS: 1.8 10*3/uL (ref 0.7–3.1)
LYMPHS: 26 %
MCH: 31.7 pg (ref 26.6–33.0)
MCHC: 34.6 g/dL (ref 31.5–35.7)
MCV: 92 fL (ref 79–97)
Monocytes Absolute: 0.9 10*3/uL (ref 0.1–0.9)
Monocytes: 13 %
NEUTROS ABS: 4.1 10*3/uL (ref 1.4–7.0)
Neutrophils: 57 %
PLATELETS: 235 10*3/uL (ref 150–379)
RBC: 5.43 x10E6/uL (ref 4.14–5.80)
RDW: 13.8 % (ref 12.3–15.4)
WBC: 7 10*3/uL (ref 3.4–10.8)

## 2015-11-23 LAB — RENAL FUNCTION PANEL
ALBUMIN: 4.1 g/dL (ref 3.6–4.8)
BUN/Creatinine Ratio: 14 (ref 10–24)
BUN: 22 mg/dL (ref 8–27)
CO2: 21 mmol/L (ref 18–29)
CREATININE: 1.56 mg/dL — AB (ref 0.76–1.27)
Calcium: 9.3 mg/dL (ref 8.6–10.2)
Chloride: 106 mmol/L (ref 96–106)
GFR calc Af Amer: 53 mL/min/{1.73_m2} — ABNORMAL LOW (ref >59)
GFR, EST NON AFRICAN AMERICAN: 46 mL/min/{1.73_m2} — AB (ref >59)
Glucose: 115 mg/dL — ABNORMAL HIGH (ref 65–99)
PHOSPHORUS: 2.9 mg/dL (ref 2.5–4.5)
Potassium: 4.4 mmol/L (ref 3.5–5.2)
Sodium: 144 mmol/L (ref 134–144)

## 2015-11-23 SURGERY — PHACOEMULSIFICATION, CATARACT, WITH IOL INSERTION
Anesthesia: Monitor Anesthesia Care | Site: Eye | Laterality: Left | Wound class: Clean

## 2015-11-23 MED ORDER — SODIUM HYALURONATE 10 MG/ML IO SOLN
INTRAOCULAR | Status: AC
  Filled 2015-11-23: qty 0.85

## 2015-11-23 MED ORDER — EPINEPHRINE HCL 1 MG/ML IJ SOLN
INTRAMUSCULAR | Status: DC | PRN
  Administered 2015-11-23: 250 mL via OPHTHALMIC

## 2015-11-23 MED ORDER — EPINEPHRINE HCL 1 MG/ML IJ SOLN
INTRAMUSCULAR | Status: AC
  Filled 2015-11-23: qty 1

## 2015-11-23 MED ORDER — ARMC OPHTHALMIC DILATING GEL
1.0000 "application " | OPHTHALMIC | Status: AC
  Administered 2015-11-23 (×2): 1 via OPHTHALMIC

## 2015-11-23 MED ORDER — LIDOCAINE HCL (PF) 1 % IJ SOLN
INTRAMUSCULAR | Status: AC
  Filled 2015-11-23: qty 2

## 2015-11-23 MED ORDER — POVIDONE-IODINE 5 % OP SOLN
OPHTHALMIC | Status: AC
  Administered 2015-11-23: 1 via OPHTHALMIC
  Filled 2015-11-23: qty 30

## 2015-11-23 MED ORDER — MOXIFLOXACIN HCL 0.5 % OP SOLN
OPHTHALMIC | Status: AC
  Filled 2015-11-23: qty 3

## 2015-11-23 MED ORDER — CEFUROXIME OPHTHALMIC INJECTION 1 MG/0.1 ML
INJECTION | OPHTHALMIC | Status: DC | PRN
  Administered 2015-11-23: 1 mg via INTRACAMERAL

## 2015-11-23 MED ORDER — MOXIFLOXACIN HCL 0.5 % OP SOLN
1.0000 [drp] | OPHTHALMIC | Status: AC | PRN
Start: 2015-11-23 — End: 2015-11-24

## 2015-11-23 MED ORDER — ONDANSETRON HCL 4 MG/2ML IJ SOLN
INTRAMUSCULAR | Status: DC | PRN
  Administered 2015-11-23: 4 mg via INTRAVENOUS

## 2015-11-23 MED ORDER — NA HYALUR & NA CHOND-NA HYALUR 0.55-0.5 ML IO KIT
PACK | INTRAOCULAR | Status: AC
  Filled 2015-11-23: qty 1.05

## 2015-11-23 MED ORDER — LIDOCAINE HCL (PF) 4 % IJ SOLN
INTRAMUSCULAR | Status: AC
  Filled 2015-11-23: qty 5

## 2015-11-23 MED ORDER — NA CHONDROIT SULF-NA HYALURON 40-17 MG/ML IO SOLN
INTRAOCULAR | Status: DC | PRN
  Administered 2015-11-23: 1 mL via INTRAOCULAR

## 2015-11-23 MED ORDER — LIDOCAINE HCL (PF) 4 % IJ SOLN
INTRAMUSCULAR | Status: DC | PRN
  Administered 2015-11-23: 250 mL via OPHTHALMIC

## 2015-11-23 MED ORDER — TETRACAINE HCL 0.5 % OP SOLN
1.0000 [drp] | Freq: Once | OPHTHALMIC | Status: AC
  Administered 2015-11-23: 1 [drp] via OPHTHALMIC

## 2015-11-23 MED ORDER — FENTANYL CITRATE (PF) 100 MCG/2ML IJ SOLN
INTRAMUSCULAR | Status: DC | PRN
  Administered 2015-11-23: 50 ug via INTRAVENOUS
  Administered 2015-11-23 (×2): 25 ug via INTRAVENOUS

## 2015-11-23 MED ORDER — MIDAZOLAM HCL 2 MG/2ML IJ SOLN
INTRAMUSCULAR | Status: DC | PRN
  Administered 2015-11-23: 0.5 mg via INTRAVENOUS
  Administered 2015-11-23: 1 mg via INTRAVENOUS
  Administered 2015-11-23: 0.5 mg via INTRAVENOUS

## 2015-11-23 MED ORDER — SODIUM HYALURONATE 23 MG/ML IO SOLN
INTRAOCULAR | Status: DC | PRN
  Administered 2015-11-23: 0.6 mL via INTRAOCULAR

## 2015-11-23 MED ORDER — ARMC OPHTHALMIC DILATING GEL
OPHTHALMIC | Status: AC
  Administered 2015-11-23: 1 via OPHTHALMIC
  Filled 2015-11-23: qty 0.25

## 2015-11-23 MED ORDER — SODIUM HYALURONATE 23 MG/ML IO SOLN
INTRAOCULAR | Status: AC
  Filled 2015-11-23: qty 0.6

## 2015-11-23 MED ORDER — CEFUROXIME OPHTHALMIC INJECTION 1 MG/0.1 ML
INJECTION | OPHTHALMIC | Status: AC
  Filled 2015-11-23: qty 0.1

## 2015-11-23 MED ORDER — EPINEPHRINE HCL 1 MG/ML IJ SOLN
INTRAMUSCULAR | Status: AC
  Filled 2015-11-23: qty 2

## 2015-11-23 MED ORDER — SODIUM CHLORIDE 0.9 % IV SOLN
INTRAVENOUS | Status: DC
  Administered 2015-11-23: 09:00:00 via INTRAVENOUS

## 2015-11-23 MED ORDER — POVIDONE-IODINE 5 % OP SOLN
1.0000 "application " | Freq: Once | OPHTHALMIC | Status: AC
  Administered 2015-11-23: 1 via OPHTHALMIC

## 2015-11-23 MED ORDER — TETRACAINE HCL 0.5 % OP SOLN
OPHTHALMIC | Status: AC
  Administered 2015-11-23: 1 [drp] via OPHTHALMIC
  Filled 2015-11-23: qty 2

## 2015-11-23 MED ORDER — SODIUM HYALURONATE 10 MG/ML IO SOLN
INTRAOCULAR | Status: DC | PRN
  Administered 2015-11-23: 0.85 mL via INTRAOCULAR

## 2015-11-23 MED ORDER — MOXIFLOXACIN HCL 0.5 % OP SOLN
OPHTHALMIC | Status: DC | PRN
  Administered 2015-11-23: 1 [drp] via OPHTHALMIC

## 2015-11-23 SURGICAL SUPPLY — 22 items
CANNULA ANT/CHMB 27GA (MISCELLANEOUS) ×2 IMPLANT
CUP MEDICINE 2OZ PLAST GRAD ST (MISCELLANEOUS) ×2 IMPLANT
GLOVE BIO SURGEON STRL SZ8 (GLOVE) ×2 IMPLANT
GLOVE BIOGEL M 6.5 STRL (GLOVE) ×2 IMPLANT
GLOVE SURG LX 7.5 STRW (GLOVE) ×1
GLOVE SURG LX STRL 7.5 STRW (GLOVE) ×1 IMPLANT
GOWN STRL REUS W/ TWL LRG LVL3 (GOWN DISPOSABLE) ×2 IMPLANT
GOWN STRL REUS W/TWL LRG LVL3 (GOWN DISPOSABLE) ×2
LENS IOL ACRSF IQ PC 25.5 (Intraocular Lens) ×1 IMPLANT
LENS IOL ACRYSOF IQ POST 25.5 (Intraocular Lens) ×2 IMPLANT
PACK CATARACT (MISCELLANEOUS) ×2 IMPLANT
PACK CATARACT BRASINGTON LX (MISCELLANEOUS) ×2 IMPLANT
PACK EYE AFTER SURG (MISCELLANEOUS) ×2 IMPLANT
SOL BSS BAG (MISCELLANEOUS) ×2
SOL PREP PVP 2OZ (MISCELLANEOUS) ×2
SOLUTION BSS BAG (MISCELLANEOUS) ×1 IMPLANT
SOLUTION PREP PVP 2OZ (MISCELLANEOUS) ×1 IMPLANT
SYR 3ML LL SCALE MARK (SYRINGE) ×2 IMPLANT
SYR 5ML LL (SYRINGE) ×2 IMPLANT
SYR TB 1ML 27GX1/2 LL (SYRINGE) ×2 IMPLANT
WATER STERILE IRR 1000ML POUR (IV SOLUTION) ×2 IMPLANT
WIPE NON LINTING 3.25X3.25 (MISCELLANEOUS) ×2 IMPLANT

## 2015-11-23 NOTE — Anesthesia Preprocedure Evaluation (Signed)
Anesthesia Evaluation  Patient identified by MRN, date of birth, ID band Patient awake    Reviewed: Allergy & Precautions, H&P , NPO status , Patient's Chart, lab work & pertinent test results, reviewed documented beta blocker date and time   History of Anesthesia Complications Negative for: history of anesthetic complications  Airway Mallampati: II  TM Distance: >3 FB Neck ROM: full    Dental no notable dental hx. (+) Teeth Intact, Caps   Pulmonary neg pulmonary ROS, former smoker,    Pulmonary exam normal breath sounds clear to auscultation       Cardiovascular Exercise Tolerance: Good (-) hypertension(-) angina(-) CAD, (-) Past MI, (-) Cardiac Stents and (-) CABG Normal cardiovascular exam(-) dysrhythmias + Valvular Problems/Murmurs  Rhythm:regular Rate:Normal     Neuro/Psych negative neurological ROS  negative psych ROS   GI/Hepatic Neg liver ROS, hiatal hernia, GERD  ,  Endo/Other  negative endocrine ROS  Renal/GU Renal disease (kidney stones)  negative genitourinary   Musculoskeletal   Abdominal   Peds  Hematology negative hematology ROS (+)   Anesthesia Other Findings Past Medical History:   PE (pulmonary embolism)                                      Murmur                                                       Dizziness                                                      Comment:medication related   Diverticulosis                                               Hiatal hernia                                                GERD (gastroesophageal reflux disease)                       Environmental allergies                                      HOH (hard of hearing)                                        Hypercholesteremia                                           Kidney stones  Wheezing                                                      Reproductive/Obstetrics negative OB ROS                             Anesthesia Physical Anesthesia Plan  ASA: II  Anesthesia Plan: MAC   Post-op Pain Management:    Induction:   Airway Management Planned:   Additional Equipment:   Intra-op Plan:   Post-operative Plan:   Informed Consent: I have reviewed the patients History and Physical, chart, labs and discussed the procedure including the risks, benefits and alternatives for the proposed anesthesia with the patient or authorized representative who has indicated his/her understanding and acceptance.   Dental Advisory Given  Plan Discussed with: Anesthesiologist, CRNA and Surgeon  Anesthesia Plan Comments:         Anesthesia Quick Evaluation

## 2015-11-23 NOTE — Transfer of Care (Signed)
Immediate Anesthesia Transfer of Care Note  Patient: Jacob Patterson  Procedure(s) Performed: Procedure(s) with comments: CATARACT EXTRACTION PHACO AND INTRAOCULAR LENS PLACEMENT (IOC) (Left) - Lot # [id number] H [place]: 01:03.8 AP%: 19.5 CDE: 12.46   Patient Location: PACU  Anesthesia Type:MAC  Level of Consciousness: awake, alert  and oriented  Airway & Oxygen Therapy: Patient Spontanous Breathing  Post-op Assessment: Report given to RN and Post -op Vital signs reviewed and stable  Post vital signs: Reviewed and stable  Last Vitals:  Filed Vitals:   11/20/15 1025 11/23/15 0837  BP: 122/73 124/63  Pulse: 78 75  Temp:  36.8 C  Resp:  20    Last Pain:  Filed Vitals:   11/23/15 0844  PainSc: 2       Patients Stated Pain Goal: 0 ([id number])  Complications: No apparent anesthesia complications

## 2015-11-23 NOTE — H&P (Signed)
  The History and Physical notes are on paper, have been signed, and are to be scanned. The patient remains stable and unchanged from the H&P.   Previous H&P reviewed, patient examined, and there are no changes.  Jacob Patterson 11/23/2015 10:04 AM

## 2015-11-23 NOTE — Op Note (Signed)
OPERATIVE NOTE  Jacob Patterson [id number] 11/23/2015   PREOPERATIVE DIAGNOSIS:  Nuclear sclerotic cataract left eye.  H25.12   POSTOPERATIVE DIAGNOSIS:    Nuclear sclerotic cataract left eye.     PROCEDURE:  Phacoemusification with posterior chamber intraocular lens placement of the left eye   LENS:   Implant Name Type Inv. Item Serial No. Manufacturer Lot No. LRB No. Used  IMPLANT LENS - [id number] Intraocular Lens IMPLANT LENS [id number]   Left 1       [id number] 25.5   ULTRASOUND TIME: 1 minutes 03 seconds.  CDE 12.46   SURGEON:  Benay Pillow, MD, MPH   ANESTHESIA:  Topical with tetracaine drops and 2% Xylocaine jelly, augmented with 1% preservative-free intracameral lidocaine.   COMPLICATIONS:  None.   DESCRIPTION OF PROCEDURE:  The patient was identified in the holding room and transported to the operating room and placed in the supine position under the operating microscope.  The left eye was identified as the operative eye and it was prepped and draped in the usual sterile ophthalmic fashion.   A 1.0 millimeter clear-corneal paracentesis was made at the 5:00 position. 0.5 ml of preservative-free 1% lidocaine with epinephrine was injected into the anterior chamber.  The anterior chamber was filled with Healon 5 viscoelastic.  A 2.4 millimeter keratome was used to make a near-clear corneal incision at the 2:00 position.  A curvilinear capsulorrhexis was made with a cystotome and capsulorrhexis forceps.  Balanced salt solution was used to hydrodissect and hydrodelineate the nucleus.   Phacoemulsification was then used in stop and chop fashion to remove the lens nucleus and epinucleus.  The remaining cortex was then removed using the irrigation and aspiration handpiece. Healon was then placed into the capsular bag to distend it for lens placement.  A lens was then injected into the capsular bag.  The remaining viscoelastic was aspirated.   Wounds were hydrated with balanced  salt solution.  The anterior chamber was inflated to a physiologic pressure with balanced salt solution.   Intracameral cefuroxime 0.1 mL at 10mg /mL was injected into the eye.  No wound leaks were noted.  Topical Vigamox drops were applied to the eye.  The patient was taken to the recovery room in stable condition without complications of anesthesia or surgery  Benay Pillow 11/23/2015, 10:35 AM

## 2015-11-23 NOTE — Anesthesia Postprocedure Evaluation (Signed)
Anesthesia Post Note  Patient: Jacob Patterson  Procedure(s) Performed: Procedure(s) (LRB): CATARACT EXTRACTION PHACO AND INTRAOCULAR LENS PLACEMENT (IOC) (Left)  Patient location during evaluation: PACU Anesthesia Type: MAC Level of consciousness: awake and alert Pain management: pain level controlled Vital Signs Assessment: post-procedure vital signs reviewed and stable Respiratory status: spontaneous breathing, nonlabored ventilation, respiratory function stable and patient connected to nasal cannula oxygen Cardiovascular status: blood pressure returned to baseline and stable Postop Assessment: no signs of nausea or vomiting Anesthetic complications: no    Last Vitals:  Filed Vitals:   11/23/15 1038 11/23/15 1042  BP: 119/85 118/70  Pulse:    Temp:    Resp:      Last Pain:  Filed Vitals:   11/23/15 1046  PainSc: 2                  Martha Clan

## 2015-11-23 NOTE — Discharge Instructions (Signed)
Eye Surgery Discharge Instructions  Expect mild scratchy sensation or mild soreness. DO NOT RUB YOUR EYE!  The day of surgery:  Minimal physical activity, but bed rest is not required  No reading, computer work, or close hand work  No bending, lifting, or straining.  May watch TV  For 24 hours:  No driving, legal decisions, or alcoholic beverages  Safety precautions  Eat anything you prefer: It is better to start with liquids, then soup then solid foods.  _____ Eye patch should be worn until postoperative exam tomorrow.  ____ Solar shield eyeglasses should be worn for comfort in the sunlight/patch while sleeping  Resume all regular medications including aspirin or Coumadin if these were discontinued prior to surgery. You may shower, bathe, shave, or wash your hair. Tylenol may be taken for mild discomfort.  Call your doctor if you experience significant pain, nausea, or vomiting, fever > 101 or other signs of infection. [phone] or [phone] Specific instructions:  Follow-up Information    Follow up with Benay Pillow, MD In 1 day.   Specialty:  Ophthalmology   Why:  June 16 at 9:35am   Contact information:   [address] [place] [address] [phone]

## 2016-01-15 NOTE — Telephone Encounter (Signed)
error 

## 2016-01-22 ENCOUNTER — Encounter: Payer: Self-pay | Admitting: Family Medicine

## 2016-01-22 ENCOUNTER — Ambulatory Visit (INDEPENDENT_AMBULATORY_CARE_PROVIDER_SITE_OTHER): Payer: Medicare Other | Admitting: Family Medicine

## 2016-01-22 ENCOUNTER — Ambulatory Visit
Admission: RE | Admit: 2016-01-22 | Discharge: 2016-01-22 | Disposition: A | Discharge status: Home / Self Care | Payer: Medicare Other | Source: Ambulatory Visit | Attending: Family Medicine | Admitting: Family Medicine

## 2016-01-22 VITALS — BP 118/70 | HR 80 | Temp 97.8°F | Resp 16 | Wt 198.0 lb

## 2016-01-22 DIAGNOSIS — Z8709 Personal history of other diseases of the respiratory system: Secondary | ICD-10-CM

## 2016-01-22 DIAGNOSIS — M25552 Pain in left hip: Secondary | ICD-10-CM

## 2016-01-22 DIAGNOSIS — M1612 Unilateral primary osteoarthritis, left hip: Secondary | ICD-10-CM | POA: Insufficient documentation

## 2016-01-22 NOTE — Progress Notes (Signed)
Patient: Jacob Patterson Male    DOB: [birth year]   65 y.o.   MRN: [id number] Visit Date: 01/22/2016  Today's Provider: Vernie Murders, PA   Chief Complaint  Patient presents with  . Hip Pain   Subjective:    Hip Pain   Incident onset: Has been going on for about a year.  The pain is present in the left hip. The pain has been fluctuating since onset. Associated symptoms include an inability to bear weight (When the pain flares). Pertinent negatives include no loss of sensation or tingling.   Patient Active Problem List   Diagnosis Date Noted  . Elevated prostate specific antigen (PSA) 03/27/2015  . Hx of pulmonary infarction 03/27/2015  . Arthritis 10/31/2014  . 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 D-dimer, elevated 10/31/2014  . Abnormal prostate specific antigen 10/31/2014  . Acid reflux 10/31/2014  . Hypercholesteremia 10/31/2014  . Hernia, inguinal 10/31/2014  . Arthralgia of hip 10/31/2014  . Awareness of heartbeats 10/31/2014  . Pleural cavity effusion 10/31/2014  . Phlebitis after infusion 10/31/2014  . Calculus of kidney 10/31/2014  . CD (contact dermatitis) 02/01/2008  . Excessive urination at night 05/14/2007  . Can't get food down 04/11/2006   Past Surgical History:  Procedure Laterality Date  . CATARACT EXTRACTION W/PHACO Left 11/23/2015   Procedure: CATARACT EXTRACTION PHACO AND INTRAOCULAR LENS PLACEMENT (IOC);  Surgeon: Eulogio Bear, MD;  Location: [facility] ORS;  Service: Ophthalmology;  Laterality: Left;  Lot # [id number] H [place]: 01:03.8 AP%: 19.5 CDE: 12.46   . COLONOSCOPY  2008  . kidney stent    . TONSILLECTOMY    . TONSILLECTOMY AND ADENOIDECTOMY  1960   Family History  Problem Relation Age of Onset  . Arthritis Mother   . Stroke Mother   . Arthritis Father   . Dementia Father   . Cataracts Father   . Hyperlipidemia Sister   . Breast cancer Paternal Grandmother   . Heart attack Paternal Grandfather    No Known Allergies    Current Meds  Medication Sig  . aspirin 81 MG tablet Take 81 mg by mouth daily.  Marland Kitchen B-Complex TABS Take 1 tablet by mouth every morning.  . Cholecalciferol (VITAMIN D3) 2000 UNITS TABS Take 1 tablet by mouth every morning.  . finasteride (PROSCAR) 5 MG tablet Take 5 mg by mouth every morning.  . Magnesium 200 MG TABS Take 1 tablet by mouth 2 (two) times daily.  . Melatonin-Pyridoxine (MELATONEX PO) Take 1 tablet by mouth at bedtime. 0.5  . Multiple Vitamins-Minerals (MULTIVITAMIN PO) Take 1 tablet by mouth every morning.  . niacin 500 MG tablet Take 500 mg by mouth at bedtime.  . tamsulosin (FLOMAX) 0.4 MG CAPS capsule Take 1 capsule (0.4 mg total) by mouth daily after supper.  . TURMERIC PO Take 1 capsule by mouth every morning.    Review of Systems  Constitutional: Negative.   Respiratory: Negative.   Cardiovascular: Negative.   Gastrointestinal: Negative.   Musculoskeletal: Positive for arthralgias and gait problem. Negative for back pain, joint swelling, myalgias, neck pain and neck stiffness.  Neurological: Negative for dizziness, tingling, light-headedness and headaches.    Social History  Substance Use Topics  . Smoking status: Former Smoker    Types: Cigarettes  . Smokeless tobacco: Not on file     Comment: Quit in 1989  . Alcohol use 0.0 oz/week     Comment: occasionally drinks wine or beer  Objective:   BP 118/70 (BP Location: Right Arm, Patient Position: Sitting, Cuff Size: Normal)   Pulse 80   Temp 97.8 F (36.6 C) (Oral)   Resp 16   Wt 198 lb (89.8 kg)   BMI 31.96 kg/m   Physical Exam  Constitutional: He is oriented to person, place, and time. He appears well-developed and well-nourished. No distress.  HENT:  Head: Normocephalic and atraumatic.  Right Ear: Hearing normal.  Left Ear: Hearing normal.  Nose: Nose normal.  Eyes: Conjunctivae and lids are normal. Right eye exhibits no discharge. Left eye exhibits no discharge. No scleral icterus.   Cardiovascular: Normal rate and regular rhythm.   Pulmonary/Chest: Effort normal and breath sounds normal. No respiratory distress.  Musculoskeletal:  Decreased external rotation of the left hip with pain. Normal pulses and other joints good ROM. Normal symmetric pulses and sensation. Many clusters of superficial varicose veins.  Neurological: He is alert and oriented to person, place, and time.  Skin: Skin is intact. No lesion and no rash noted.  Psychiatric: He has a normal mood and affect. His speech is normal and behavior is normal. Thought content normal.      Assessment & Plan:     1. Hip pain, left Progression of left hip pain with degenerative changes on x-rays in 2015. Will repeat films to assess progression. May use NSAID of choice. May need referral to orthopedist pending x-ray reports. - DG HIP UNILAT WITH PELVIS 2-3 VIEWS LEFT  2. Hx of pulmonary infarction Feeling well. No chest pains or dyspnea. Continues ASA daily. No longer on Eliquis.       Vernie Murders, PA  [facility]

## 2016-01-23 ENCOUNTER — Other Ambulatory Visit: Payer: Self-pay | Admitting: Family Medicine

## 2016-01-23 DIAGNOSIS — M25552 Pain in left hip: Secondary | ICD-10-CM

## 2016-01-24 ENCOUNTER — Telehealth: Payer: Self-pay

## 2016-01-24 DIAGNOSIS — M1990 Unspecified osteoarthritis, unspecified site: Secondary | ICD-10-CM

## 2016-01-24 NOTE — Telephone Encounter (Signed)
Patient advised as directed below. Ortho referral ordered. Patient preferred a appointment in late September.   Thanks, [employer]

## 2016-01-24 NOTE — Telephone Encounter (Signed)
-----   Message from Margo Common, [place] sent at 01/23/2016  6:54 PM EDT ----- Compared to past films osteoarthritis worse. Recommend referral to orthopedist.

## 2016-02-20 DIAGNOSIS — H2511 Age-related nuclear cataract, right eye: Secondary | ICD-10-CM | POA: Diagnosis not present

## 2016-02-26 ENCOUNTER — Encounter: Payer: Self-pay | Admitting: *Deleted

## 2016-02-28 DIAGNOSIS — M1612 Unilateral primary osteoarthritis, left hip: Secondary | ICD-10-CM | POA: Diagnosis not present

## 2016-02-29 ENCOUNTER — Ambulatory Visit: Payer: Medicare Other | Admitting: Anesthesiology

## 2016-02-29 ENCOUNTER — Encounter
Admission: RE | Disposition: A | Discharge status: Home / Self Care | Payer: Self-pay | Source: Ambulatory Visit | Attending: Ophthalmology

## 2016-02-29 ENCOUNTER — Ambulatory Visit
Admission: RE | Admit: 2016-02-29 | Discharge: 2016-02-29 | Disposition: A | Discharge status: Home / Self Care | Payer: Medicare Other | Source: Ambulatory Visit | Attending: Ophthalmology | Admitting: Ophthalmology

## 2016-02-29 ENCOUNTER — Encounter: Payer: Self-pay | Admitting: *Deleted

## 2016-02-29 DIAGNOSIS — K449 Diaphragmatic hernia without obstruction or gangrene: Secondary | ICD-10-CM | POA: Insufficient documentation

## 2016-02-29 DIAGNOSIS — Z87891 Personal history of nicotine dependence: Secondary | ICD-10-CM | POA: Diagnosis not present

## 2016-02-29 DIAGNOSIS — R42 Dizziness and giddiness: Secondary | ICD-10-CM | POA: Diagnosis not present

## 2016-02-29 DIAGNOSIS — K5790 Diverticulosis of intestine, part unspecified, without perforation or abscess without bleeding: Secondary | ICD-10-CM | POA: Insufficient documentation

## 2016-02-29 DIAGNOSIS — R011 Cardiac murmur, unspecified: Secondary | ICD-10-CM | POA: Diagnosis not present

## 2016-02-29 DIAGNOSIS — H2511 Age-related nuclear cataract, right eye: Secondary | ICD-10-CM | POA: Diagnosis not present

## 2016-02-29 DIAGNOSIS — E7800 Pure hypercholesterolemia, unspecified: Secondary | ICD-10-CM | POA: Insufficient documentation

## 2016-02-29 DIAGNOSIS — Z87442 Personal history of urinary calculi: Secondary | ICD-10-CM | POA: Insufficient documentation

## 2016-02-29 DIAGNOSIS — K219 Gastro-esophageal reflux disease without esophagitis: Secondary | ICD-10-CM | POA: Insufficient documentation

## 2016-02-29 HISTORY — PX: CATARACT EXTRACTION W/PHACO: SHX586

## 2016-02-29 HISTORY — DX: Unspecified osteoarthritis, unspecified site: M19.90

## 2016-02-29 SURGERY — PHACOEMULSIFICATION, CATARACT, WITH IOL INSERTION
Anesthesia: Monitor Anesthesia Care | Site: Eye | Laterality: Right | Wound class: Clean

## 2016-02-29 MED ORDER — MOXIFLOXACIN HCL 0.5 % OP SOLN: 1.0000 [drp] | OPHTHALMIC | Status: DC | PRN

## 2016-02-29 MED ORDER — EPINEPHRINE HCL 1 MG/ML IJ SOLN
INTRAMUSCULAR | Status: AC
  Filled 2016-02-29: qty 1

## 2016-02-29 MED ORDER — MOXIFLOXACIN HCL 0.5 % OP SOLN
OPHTHALMIC | Status: DC | PRN
  Administered 2016-02-29: 1 [drp] via OPHTHALMIC

## 2016-02-29 MED ORDER — MIDAZOLAM HCL 2 MG/2ML IJ SOLN
INTRAMUSCULAR | Status: DC | PRN
  Administered 2016-02-29 (×4): 0.5 mg via INTRAVENOUS

## 2016-02-29 MED ORDER — POVIDONE-IODINE 5 % OP SOLN
1.0000 "application " | Freq: Once | OPHTHALMIC | Status: AC
  Administered 2016-02-29: 1 via OPHTHALMIC

## 2016-02-29 MED ORDER — NA CHONDROIT SULF-NA HYALURON 40-30 MG/ML IO SOLN
INTRAOCULAR | Status: AC
  Filled 2016-02-29: qty 0.5

## 2016-02-29 MED ORDER — TETRACAINE HCL 0.5 % OP SOLN
1.0000 [drp] | Freq: Once | OPHTHALMIC | Status: AC
  Administered 2016-02-29: 1 [drp] via OPHTHALMIC

## 2016-02-29 MED ORDER — ARMC OPHTHALMIC DILATING GEL
1.0000 "application " | OPHTHALMIC | Status: AC
  Administered 2016-02-29 (×2): 1 via OPHTHALMIC

## 2016-02-29 MED ORDER — SODIUM HYALURONATE 23 MG/ML IO SOLN
INTRAOCULAR | Status: DC | PRN
  Administered 2016-02-29: 0.6 mL via INTRAOCULAR

## 2016-02-29 MED ORDER — CARBACHOL 0.01 % IO SOLN
INTRAOCULAR | Status: DC | PRN
  Administered 2016-02-29: 0.5 mL via INTRAOCULAR

## 2016-02-29 MED ORDER — SODIUM HYALURONATE 10 MG/ML IO SOLN
INTRAOCULAR | Status: AC
  Filled 2016-02-29: qty 0.85

## 2016-02-29 MED ORDER — SODIUM CHLORIDE 0.9 % IV SOLN
INTRAVENOUS | Status: DC
  Administered 2016-02-29: 07:00:00 via INTRAVENOUS

## 2016-02-29 MED ORDER — SODIUM HYALURONATE 23 MG/ML IO SOLN
INTRAOCULAR | Status: AC
  Filled 2016-02-29: qty 0.6

## 2016-02-29 MED ORDER — BSS IO SOLN
INTRAOCULAR | Status: DC | PRN
  Administered 2016-02-29: 4 mL via OPHTHALMIC

## 2016-02-29 MED ORDER — LIDOCAINE HCL (PF) 4 % IJ SOLN
INTRAMUSCULAR | Status: AC
  Filled 2016-02-29: qty 5

## 2016-02-29 MED ORDER — SODIUM HYALURONATE 10 MG/ML IO SOLN
INTRAOCULAR | Status: DC | PRN
  Administered 2016-02-29: 0.85 mL via INTRAOCULAR

## 2016-02-29 MED ORDER — EPINEPHRINE HCL 1 MG/ML IJ SOLN
INTRAOCULAR | Status: DC | PRN
  Administered 2016-02-29: 1 mL via OPHTHALMIC

## 2016-02-29 SURGICAL SUPPLY — 24 items
CANNULA ANT/CHMB 27GA (MISCELLANEOUS) ×4 IMPLANT
CUP MEDICINE 2OZ PLAST GRAD ST (MISCELLANEOUS) ×2 IMPLANT
GLOVE BIO SURGEON STRL SZ8 (GLOVE) ×2 IMPLANT
GLOVE BIOGEL M 6.5 STRL (GLOVE) ×2 IMPLANT
GLOVE SURG LX 7.5 STRW (GLOVE) ×1
GLOVE SURG LX STRL 7.5 STRW (GLOVE) ×1 IMPLANT
GOWN STRL REUS W/ TWL LRG LVL3 (GOWN DISPOSABLE) ×2 IMPLANT
GOWN STRL REUS W/TWL LRG LVL3 (GOWN DISPOSABLE) ×2
LENS IOL ACRSF IQ PC 25.5 (Intraocular Lens) ×1 IMPLANT
LENS IOL ACRYSOF IQ POST 25.5 (Intraocular Lens) ×2 IMPLANT
NEEDLE FILTER BLUNT 18X 1/2SAF (NEEDLE) ×1
NEEDLE FILTER BLUNT 18X1 1/2 (NEEDLE) ×1 IMPLANT
PACK CATARACT (MISCELLANEOUS) ×2 IMPLANT
PACK CATARACT BRASINGTON LX (MISCELLANEOUS) ×2 IMPLANT
PACK EYE AFTER SURG (MISCELLANEOUS) ×2 IMPLANT
SOL BSS BAG (MISCELLANEOUS) ×2
SOL PREP PVP 2OZ (MISCELLANEOUS) ×2
SOLUTION BSS BAG (MISCELLANEOUS) ×1 IMPLANT
SOLUTION PREP PVP 2OZ (MISCELLANEOUS) ×1 IMPLANT
SYR 3ML LL SCALE MARK (SYRINGE) ×4 IMPLANT
SYR 5ML LL (SYRINGE) ×4 IMPLANT
SYR TB 1ML 27GX1/2 LL (SYRINGE) ×2 IMPLANT
WATER STERILE IRR 250ML POUR (IV SOLUTION) ×2 IMPLANT
WIPE NON LINTING 3.25X3.25 (MISCELLANEOUS) ×2 IMPLANT

## 2016-02-29 NOTE — Anesthesia Postprocedure Evaluation (Signed)
Anesthesia Post Note  Patient: Jacob Patterson  Procedure(s) Performed: Procedure(s) (LRB): CATARACT EXTRACTION PHACO AND INTRAOCULAR LENS PLACEMENT (IOC) (Right)  Patient location during evaluation: PACU Anesthesia Type: MAC Level of consciousness: awake and awake and alert Vital Signs Assessment: post-procedure vital signs reviewed and stable Respiratory status: spontaneous breathing and patient connected to nasal cannula oxygen Cardiovascular status: blood pressure returned to baseline and stable Anesthetic complications: no    Last Vitals:  Vitals:   02/26/16 1503 02/29/16 0645  BP: 123/75 140/77  Pulse: 69 73  Resp:  18  Temp:  36.4 C    Last Pain:  Vitals:   02/29/16 0645  TempSrc: Oral                 Viva Gallaher C

## 2016-02-29 NOTE — Op Note (Signed)
OPERATIVE NOTE  PERFECTO BUR [id number] 02/29/2016   PREOPERATIVE DIAGNOSIS:  Nuclear sclerotic cataract right eye.  H25.11   POSTOPERATIVE DIAGNOSIS:    Nuclear sclerotic cataract right eye.     PROCEDURE:  Phacoemusification with posterior chamber intraocular lens placement of the right eye   LENS:   Implant Name Type Inv. Item Serial No. Manufacturer Lot No. LRB No. Used  IMPLANT LENS - [id number] Intraocular Lens IMPLANT LENS [id number] ALCON   Right 1       [id number] 25.5   ULTRASOUND TIME: 0 minutes 26 seconds.  CDE 3.93   SURGEON:  Benay Pillow, MD, MPH  ANESTHESIOLOGIST: Anesthesiologist: Gunnar Fusi, MD CRNA: Jennette Bill   ANESTHESIA:  Topical with tetracaine drops and 2% Xylocaine jelly, augmented with 1% preservative-free intracameral lidocaine.  ESTIMATED BLOOD LOSS: less than 1 mL.   COMPLICATIONS:  None.   DESCRIPTION OF PROCEDURE:  The patient was identified in the holding room and transported to the operating room and placed in the supine position under the operating microscope.  The right eye was identified as the operative eye and it was prepped in the usual sterile ophthalmic fashion.  After prep, the patient complained of burning and reached up to rub his eye.  Topical tetracaine drops were placed and the patient was reprepped, then draped.   A 1.0 millimeter clear-corneal paracentesis was made at the 10:30 position. 0.5 ml of preservative-free 1% lidocaine with epinephrine was injected into the anterior chamber.  The anterior chamber was filled with Healon 5 viscoelastic.  A 2.4 millimeter keratome was used to make a near-clear corneal incision at the 8:00 position.  A curvilinear capsulorrhexis was made with a cystotome and capsulorrhexis forceps.  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  Balanced salt solution was used to hydrodissect and hydrodelineate the nucleus.  Viscoat was placed over the iris to reposit it.   Phacoemulsification was  then used in stop and chop fashion to remove the lens nucleus and epinucleus.  The remaining cortex was then removed using the irrigation and aspiration handpiece. Healon was then placed into the capsular bag to distend it for lens placement.  A lens was then injected into the capsular bag.  The remaining viscoelastic was aspirated.    Miostat was injected.   Wounds were hydrated with balanced salt solution.  The anterior chamber was inflated to a physiologic pressure with balanced salt solution.    Intracameral vigamox 0.1 mL undiluted was injected into the eye.  No wound leaks were noted.  Topical Vigamox drops were applied to the eye.  The patient was taken to the recovery room in stable condition without complications of anesthesia or surgery  Benay Pillow 02/29/2016, 8:57 AM

## 2016-02-29 NOTE — Anesthesia Preprocedure Evaluation (Signed)
Anesthesia Evaluation  Patient identified by MRN, date of birth, ID band Patient awake    Reviewed: Allergy & Precautions, NPO status , Patient's Chart, lab work & pertinent test results  History of Anesthesia Complications Negative for: history of anesthetic complications  Airway Mallampati: II       Dental   Pulmonary neg pulmonary ROS, former smoker,           Cardiovascular negative cardio ROS  + Valvular Problems/Murmurs (murmur, no tx)      Neuro/Psych negative neurological ROS     GI/Hepatic hiatal hernia, GERD  Medicated,  Endo/Other  negative endocrine ROS  Renal/GU Renal disease (stones)     Musculoskeletal   Abdominal   Peds  Hematology negative hematology ROS (+)   Anesthesia Other Findings   Reproductive/Obstetrics                             Anesthesia Physical Anesthesia Plan  ASA: II  Anesthesia Plan: MAC   Post-op Pain Management:    Induction: Intravenous  Airway Management Planned: Nasal Cannula  Additional Equipment:   Intra-op Plan:   Post-operative Plan:   Informed Consent: I have reviewed the patients History and Physical, chart, labs and discussed the procedure including the risks, benefits and alternatives for the proposed anesthesia with the patient or authorized representative who has indicated his/her understanding and acceptance.     Plan Discussed with:   Anesthesia Plan Comments:         Anesthesia Quick Evaluation

## 2016-02-29 NOTE — H&P (Signed)
The History and Physical notes are on paper, have been signed, and are to be scanned. The patient remains stable and unchanged from the H&P.   Previous H&P reviewed, patient examined, and there are no changes.  Jacob Patterson 02/29/2016 8:12 AM

## 2016-02-29 NOTE — Discharge Instructions (Signed)
Eye Surgery Discharge Instructions  Expect mild scratchy sensation or mild soreness. DO NOT RUB YOUR EYE!  The day of surgery:  Minimal physical activity, but bed rest is not required  No reading, computer work, or close hand work  No bending, lifting, or straining.  May watch TV  For 24 hours:  No driving, legal decisions, or alcoholic beverages  Safety precautions  Eat anything you prefer: It is better to start with liquids, then soup then solid foods.  _____ Eye patch should be worn until postoperative exam tomorrow.  ____ Solar shield eyeglasses should be worn for comfort in the sunlight/patch while sleeping  Resume all regular medications including aspirin or Coumadin if these were discontinued prior to surgery. You may shower, bathe, shave, or wash your hair. Tylenol may be taken for mild discomfort.  Call your doctor if you experience significant pain, nausea, or vomiting, fever > 101 or other signs of infection. [phone] or [phone] Specific instructions:  Follow-up Information    Benay Pillow, MD .   Specialty:  Ophthalmology Why:  September 22 at 9:30am Contact information: [address] [place] [address] [phone]

## 2016-02-29 NOTE — Transfer of Care (Signed)
Immediate Anesthesia Transfer of Care Note  Patient: Jacob Patterson  Procedure(s) Performed: Procedure(s) with comments: CATARACT EXTRACTION PHACO AND INTRAOCULAR LENS PLACEMENT (IOC) (Right) - [place] 26.4 AP% 14.9 CDE 3.93 Fluid Pack Lot # [id number]  Patient Location: PACU  Anesthesia Type:MAC  Level of Consciousness: awake, alert  and oriented  Airway & Oxygen Therapy: Patient Spontanous Breathing and Patient connected to nasal cannula oxygen  Post-op Assessment: Report given to RN and Post -op Vital signs reviewed and stable  Post vital signs: Reviewed and stable  Last Vitals:  Vitals:   02/26/16 1503 02/29/16 0645  BP: 123/75 140/77  Pulse: 69 73  Resp:  18  Temp:  36.4 C    Last Pain:  Vitals:   02/29/16 0645  TempSrc: Oral         Complications: No apparent anesthesia complications

## 2016-03-13 DIAGNOSIS — M25552 Pain in left hip: Secondary | ICD-10-CM | POA: Diagnosis not present

## 2016-03-13 DIAGNOSIS — M1612 Unilateral primary osteoarthritis, left hip: Secondary | ICD-10-CM | POA: Diagnosis not present

## 2016-03-18 ENCOUNTER — Encounter: Payer: Self-pay | Admitting: Family Medicine

## 2016-03-19 DIAGNOSIS — M25652 Stiffness of left hip, not elsewhere classified: Secondary | ICD-10-CM | POA: Diagnosis not present

## 2016-03-19 DIAGNOSIS — M25552 Pain in left hip: Secondary | ICD-10-CM | POA: Diagnosis not present

## 2016-03-24 ENCOUNTER — Emergency Department
Admission: EM | Admit: 2016-03-24 | Discharge: 2016-03-25 | Disposition: A | Discharge status: Home / Self Care | Payer: Medicare Other | Attending: Emergency Medicine | Admitting: Emergency Medicine

## 2016-03-24 ENCOUNTER — Encounter: Payer: Self-pay | Admitting: Emergency Medicine

## 2016-03-24 ENCOUNTER — Emergency Department: Payer: Medicare Other

## 2016-03-24 DIAGNOSIS — Z7982 Long term (current) use of aspirin: Secondary | ICD-10-CM | POA: Diagnosis not present

## 2016-03-24 DIAGNOSIS — R079 Chest pain, unspecified: Secondary | ICD-10-CM | POA: Insufficient documentation

## 2016-03-24 DIAGNOSIS — Z87891 Personal history of nicotine dependence: Secondary | ICD-10-CM | POA: Diagnosis not present

## 2016-03-24 DIAGNOSIS — R911 Solitary pulmonary nodule: Secondary | ICD-10-CM | POA: Insufficient documentation

## 2016-03-24 DIAGNOSIS — R590 Localized enlarged lymph nodes: Secondary | ICD-10-CM | POA: Insufficient documentation

## 2016-03-24 DIAGNOSIS — R0789 Other chest pain: Secondary | ICD-10-CM | POA: Diagnosis not present

## 2016-03-24 LAB — BASIC METABOLIC PANEL
ANION GAP: 6 (ref 5–15)
BUN: 18 mg/dL (ref 6–20)
CO2: 25 mmol/L (ref 22–32)
Calcium: 9.6 mg/dL (ref 8.9–10.3)
Chloride: 106 mmol/L (ref 101–111)
Creatinine, Ser: 1.41 mg/dL — ABNORMAL HIGH (ref 0.61–1.24)
GFR calc Af Amer: 58 mL/min — ABNORMAL LOW (ref >60)
GFR, EST NON AFRICAN AMERICAN: 50 mL/min — AB (ref >60)
Glucose, Bld: 92 mg/dL (ref 65–99)
POTASSIUM: 3.7 mmol/L (ref 3.5–5.1)
SODIUM: 137 mmol/L (ref 135–145)

## 2016-03-24 LAB — CBC
HEMATOCRIT: 51.2 % (ref 40.0–52.0)
HEMOGLOBIN: 17.6 g/dL (ref 13.0–18.0)
MCH: 31 pg (ref 26.0–34.0)
MCHC: 34.3 g/dL (ref 32.0–36.0)
MCV: 90.4 fL (ref 80.0–100.0)
Platelets: 201 10*3/uL (ref 150–440)
RBC: 5.67 MIL/uL (ref 4.40–5.90)
RDW: 13.5 % (ref 11.5–14.5)
WBC: 12.8 10*3/uL — AB (ref 3.8–10.6)

## 2016-03-24 LAB — TROPONIN I: Troponin I: <0.03 ng/mL (ref <0.03)

## 2016-03-24 MED ORDER — IOPAMIDOL (ISOVUE-370) INJECTION 76%
75.0000 mL | Freq: Once | INTRAVENOUS | Status: AC | PRN
  Administered 2016-03-24: 75 mL via INTRAVENOUS

## 2016-03-24 MED ORDER — ASPIRIN EC 81 MG PO TBEC
81.0000 mg | DELAYED_RELEASE_TABLET | Freq: Once | ORAL | Status: AC
  Administered 2016-03-25: 81 mg via ORAL
  Filled 2016-03-24: qty 1

## 2016-03-24 NOTE — ED Triage Notes (Signed)
Patient states that he developed left side chest pain around 19:00 tonight. Patient states that he has a history of a PE and this feels the same. Patient denies any shortness of breath. Patient reports that he was taken off blood thinners earlier this year and now takes 81 mg of asa.

## 2016-03-24 NOTE — ED Provider Notes (Signed)
[facility] Emergency Department Provider Note   ____________________________________________   First MD Initiated Contact with Patient 03/24/16 2315     (approximate)  I have reviewed the triage vital signs and the nursing notes.   HISTORY  Chief Complaint Chest Pain    HPI Jacob Patterson is a 65 y.o. male who presents to the ED from home with a chief complaint of chest pain. Patient has a history of pulmonary embolus diagnosed approximately 1.5 years ago. He was taken off Eliquis since April of this year and has recently been less mobile secondary to hip arthralgias. Onset of left-sided chest "cramp" approximately 7 PM. Symptoms were not associated with diaphoresis, shortness of breath, nausea, vomiting or dizziness. States pain went away after an hour and patient has not experienced recurrent pain since.Denies recent fever, chills, cough, congestion, abdominal pain, diarrhea. Denies recent travel or trauma. Nothing makes his symptoms better or worse.   Past Medical History:  Diagnosis Date  . Arthritis   . Diverticulosis   . Dizziness    medication related  . Dizziness   . Environmental allergies   . GERD (gastroesophageal reflux disease)   . Hiatal hernia   . HOH (hard of hearing)   . Hypercholesteremia   . Kidney stones   . Murmur   . PE (pulmonary embolism)   . Wheezing     Patient Active Problem List   Diagnosis Date Noted  . Elevated prostate specific antigen (PSA) 03/27/2015  . Hx of pulmonary infarction 03/27/2015  . Arthritis 10/31/2014  . 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 D-dimer, elevated 10/31/2014  . Abnormal prostate specific antigen 10/31/2014  . Acid reflux 10/31/2014  . Hypercholesteremia 10/31/2014  . Hernia, inguinal 10/31/2014  . Arthralgia of hip 10/31/2014  . Awareness of heartbeats 10/31/2014  . Pleural cavity effusion 10/31/2014  . Phlebitis after infusion 10/31/2014  . Calculus of kidney 10/31/2014  .  CD (contact dermatitis) 02/01/2008  . Excessive urination at night 05/14/2007  . Can't get food down 04/11/2006    Past Surgical History:  Procedure Laterality Date  . CATARACT EXTRACTION W/PHACO Left 11/23/2015   Procedure: CATARACT EXTRACTION PHACO AND INTRAOCULAR LENS PLACEMENT (IOC);  Surgeon: Eulogio Bear, MD;  Location: [facility] ORS;  Service: Ophthalmology;  Laterality: Left;  Lot # [id number] H [place]: 01:03.8 AP%: 19.5 CDE: 12.46   . CATARACT EXTRACTION W/PHACO Right 02/29/2016   Procedure: CATARACT EXTRACTION PHACO AND INTRAOCULAR LENS PLACEMENT (IOC);  Surgeon: Eulogio Bear, MD;  Location: [facility] ORS;  Service: Ophthalmology;  Laterality: Right;  [place] 26.4 AP% 14.9 CDE 3.93 Fluid Pack Lot # [id number]  . COLONOSCOPY  2008  . kidney stent    . TONSILLECTOMY    . TONSILLECTOMY AND ADENOIDECTOMY  1960    Prior to Admission medications   Medication Sig Start Date End Date Taking? Authorizing Provider  apixaban (ELIQUIS) 5 MG TABS tablet Take 1 tablet (5 mg total) by mouth daily. Patient not taking: Reported on 02/29/2016 09/21/15   Vickki Muff Chrismon, PA  aspirin 81 MG tablet Take 81 mg by mouth daily.    Historical Provider, MD  B-Complex TABS Take 1 tablet by mouth every morning.    Historical Provider, MD  Cholecalciferol (VITAMIN D3) 2000 UNITS TABS Take 1 tablet by mouth every morning.    Historical Provider, MD  finasteride (PROSCAR) 5 MG tablet Take 5 mg by mouth every morning.    Historical Provider, MD  Magnesium 200 MG TABS Take  1 tablet by mouth 2 (two) times daily.    Historical Provider, MD  Melatonin-Pyridoxine (MELATONEX PO) Take 1 tablet by mouth at bedtime. 0.5    Historical Provider, MD  Multiple Vitamins-Minerals (MULTIVITAMIN PO) Take 1 tablet by mouth every morning.    Historical Provider, MD  niacin 500 MG tablet Take 500 mg by mouth at bedtime.    Historical Provider, MD  tamsulosin (FLOMAX) 0.4 MG CAPS capsule Take 1 capsule (0.4 mg total) by mouth daily after  supper. 09/21/15   Vickki Muff Chrismon, PA  TURMERIC PO Take 1 capsule by mouth every morning.    Historical Provider, MD    Allergies Review of patient's allergies indicates no known allergies.  Family History  Problem Relation Age of Onset  . Arthritis Mother   . Stroke Mother   . Arthritis Father   . Dementia Father   . Cataracts Father   . Hyperlipidemia Sister   . Breast cancer Paternal Grandmother   . Heart attack Paternal Grandfather     Social History Social History  Substance Use Topics  . Smoking status: Former Smoker    Types: Cigarettes  . Smokeless tobacco: Never Used     Comment: Quit in 1989  . Alcohol use 0.0 oz/week     Comment: occasionally drinks wine or beer    Review of Systems  Constitutional: No fever/chills. Eyes: No visual changes. ENT: No sore throat. Cardiovascular: Positive for chest pain. Respiratory: Denies shortness of breath. Gastrointestinal: No abdominal pain.  No nausea, no vomiting.  No diarrhea.  No constipation. Genitourinary: Negative for dysuria. Musculoskeletal: Negative for back pain. Skin: Negative for rash. Neurological: Negative for headaches, focal weakness or numbness.  10-point ROS otherwise negative.  ____________________________________________   PHYSICAL EXAM:  VITAL SIGNS: ED Triage Vitals  Enc Vitals Group     BP 03/24/16 2038 124/65     Pulse Rate 03/24/16 2038 79     Resp 03/24/16 2038 (!) 22     Temp 03/24/16 2038 98.9 F (37.2 C)     Temp Source 03/24/16 2038 Oral     SpO2 03/24/16 2038 96 %     Weight 03/24/16 2040 199 lb (90.3 kg)     Height 03/24/16 2040 5\' 7"  (1.702 m)     Head Circumference --      Peak Flow --      Pain Score 03/24/16 2048 1     Pain Loc --      Pain Edu? --      Excl. in [place]? --     Constitutional: Alert and oriented. Well appearing and in no acute distress. Eyes: Conjunctivae are normal. PERRL. EOMI. Head: Atraumatic. Nose: No congestion/rhinnorhea. Mouth/Throat:  Mucous membranes are moist.  Oropharynx non-erythematous. Neck: No stridor.   Cardiovascular: Normal rate, regular rhythm. Grossly normal heart sounds.  Good peripheral circulation. Respiratory: Normal respiratory effort.  No retractions. Lungs CTAB. Gastrointestinal: Soft and nontender. No distention. No abdominal bruits. No CVA tenderness. Musculoskeletal: No lower extremity tenderness nor edema.  No joint effusions. Neurologic:  Normal speech and language. No gross focal neurologic deficits are appreciated.  Skin:  Skin is warm, dry and intact. No rash noted. Psychiatric: Mood and affect are normal. Speech and behavior are normal.  ____________________________________________   LABS (all labs ordered are listed, but only abnormal results are displayed)  Labs Reviewed  BASIC METABOLIC PANEL - Abnormal; Notable for the following:       Result Value   Creatinine, Ser  1.41 (*)    GFR calc non Af Amer 50 (*)    GFR calc Af Amer 58 (*)    All other components within normal limits  CBC - Abnormal; Notable for the following:    WBC 12.8 (*)    All other components within normal limits  TROPONIN I  TROPONIN I   ____________________________________________  EKG  ED ECG REPORT I, Ustin Cruickshank J, the attending physician, personally viewed and interpreted this ECG.   Date: 03/24/2016  EKG Time: 2036  Rate: 82  Rhythm: normal EKG, normal sinus rhythm  Axis: Normal  Intervals:none  ST&T Change: Nonspecific  ____________________________________________  RADIOLOGY  Chest 2 view (viewed by me, interpreted per Dr. Quintella Reichert): Focal right lung base density concerning for developing infiltrate.  Clinical correlation and follow-up recommended.   CT chest interpreted per Dr. Randel Pigg: Mediastinal and left hilar lymphadenopathy associated with pulmonary  nodular densities in the right upper lobe, right middle lobe and  possibly subpleural left upper lobe largest up to 9 mm. Findings are    concerning for neoplasm. Recommend consultation with oncology.  PET-CT may prove useful from an imaging standpoint.    No large central pulmonary embolus.   ____________________________________________   PROCEDURES  Procedure(s) performed: None  Procedures  Critical Care performed: No  ____________________________________________   INITIAL IMPRESSION / ASSESSMENT AND PLAN / ED COURSE  Pertinent labs & imaging results that were available during my care of the patient were reviewed by me and considered in my medical decision making (see chart for details).  65 year old male with a past history of PE, off Eliquis since April who presents with left-sided chest pain. Denies pain for the past 3-4 hours. Initial EKG and troponin are unremarkable. CT chest does not demonstrate PE; however, lung nodules and lymphadenopathy noted. Findings are concerning for malignancy. I have discussed these findings with the patient and offered him admission for chest pain and oncological workup. Patient prefers to go home and have workup as an outpatient. Will repeat timed troponin.  Clinical Course  Comment By Time  Repeat troponin was negative and patient was discharged home without event with instructions to call oncology and cardiology for follow-up appointments. Paulette Blanch, MD 10/16 [phone]     ____________________________________________   FINAL CLINICAL IMPRESSION(S) / ED DIAGNOSES  Final diagnoses:  Nonspecific chest pain      NEW MEDICATIONS STARTED DURING THIS VISIT:  Discharge Medication List as of 03/25/2016 12:47 AM       Note:  This document was prepared using Dragon voice recognition software and may include unintentional dictation errors.    Paulette Blanch, MD 03/25/16 [phone]

## 2016-03-25 DIAGNOSIS — R079 Chest pain, unspecified: Secondary | ICD-10-CM | POA: Diagnosis not present

## 2016-03-25 LAB — TROPONIN I

## 2016-03-25 NOTE — Discharge Instructions (Signed)
1. Your CT scan shows multiple enlarged lymph nodes as well as nodules in your lungs. Please schedule an appointment with the oncologist for further workup. 2. Return to the ER for worsening symptoms, persistent vomiting, difficulty breathing or other concerns.

## 2016-03-28 ENCOUNTER — Ambulatory Visit (INDEPENDENT_AMBULATORY_CARE_PROVIDER_SITE_OTHER): Payer: Medicare Other | Admitting: Cardiology

## 2016-03-28 ENCOUNTER — Encounter: Payer: Self-pay | Admitting: Cardiology

## 2016-03-28 VITALS — BP 104/70 | HR 83 | Ht 66.5 in | Wt 193.5 lb

## 2016-03-28 DIAGNOSIS — R918 Other nonspecific abnormal finding of lung field: Secondary | ICD-10-CM | POA: Diagnosis not present

## 2016-03-28 DIAGNOSIS — R9431 Abnormal electrocardiogram [ECG] [EKG]: Secondary | ICD-10-CM

## 2016-03-28 DIAGNOSIS — Z136 Encounter for screening for cardiovascular disorders: Secondary | ICD-10-CM | POA: Diagnosis not present

## 2016-03-28 DIAGNOSIS — R0602 Shortness of breath: Secondary | ICD-10-CM

## 2016-03-28 DIAGNOSIS — Z23 Encounter for immunization: Secondary | ICD-10-CM

## 2016-03-28 DIAGNOSIS — R590 Localized enlarged lymph nodes: Secondary | ICD-10-CM | POA: Diagnosis not present

## 2016-03-28 DIAGNOSIS — R0789 Other chest pain: Secondary | ICD-10-CM | POA: Diagnosis not present

## 2016-03-28 NOTE — Patient Instructions (Addendum)
Testing/Procedures: Your physician has requested that you have an echocardiogram. Echocardiography is a painless test that uses sound waves to create images of your heart. It provides your doctor with information about the size and shape of your heart and how well your heart's chambers and valves are working. This procedure takes approximately one hour. There are no restrictions for this procedure.  [place]  Your caregiver has ordered a Stress Test with nuclear imaging. The purpose of this test is to evaluate the blood supply to your heart muscle. This procedure is referred to as a "Non-Invasive Stress Test." This is because other than having an IV started in your vein, nothing is inserted or "invades" your body. Cardiac stress tests are done to find areas of poor blood flow to the heart by determining the extent of coronary artery disease (CAD). Some patients exercise on a treadmill, which naturally increases the blood flow to your heart, while others who are  unable to walk on a treadmill due to physical limitations have a pharmacologic/chemical stress agent called Lexiscan . This medicine will mimic walking on a treadmill by temporarily increasing your coronary blood flow.   Please note: these test may take anywhere between 2-4 hours to complete  PLEASE REPORT TO [facility] AT THE FIRST DESK WILL DIRECT YOU WHERE TO GO  Date of Procedure:_Wednesday April 10, 2016 at 08:30AM__  Arrival Time for Procedure:__Arrive at 08:15AM____   PLEASE NOTIFY THE OFFICE AT LEAST 24 HOURS IN ADVANCE IF YOU ARE UNABLE TO KEEP YOUR APPOINTMENT.  [phone] AND  PLEASE NOTIFY NUCLEAR MEDICINE AT [facility] AT LEAST 24 HOURS IN ADVANCE IF YOU ARE UNABLE TO KEEP YOUR APPOINTMENT. [phone]  How to prepare for your Myoview test:  1. Do not eat or drink after midnight 2. No caffeine for 24 hours prior to test 3. No smoking 24 hours prior to test. 4. Your medication may be  taken with water.  If your doctor stopped a medication because of this test, do not take that medication. 5. Ladies, please do not wear dresses.  Skirts or pants are appropriate. Please wear a short sleeve shirt. 6. No perfume, cologne or lotion. 7. Wear comfortable walking shoes. No heels!   Follow-Up: Your physician recommends that you schedule a follow-up appointment as needed with Dr. Yvone Neu.  It was a pleasure seeing you today here in the office. Please do not hesitate to give [place] a call back if you have any further questions. [place], BSN     Echocardiogram An echocardiogram, or echocardiography, uses sound waves (ultrasound) to produce an image of your heart. The echocardiogram is simple, painless, obtained within a short period of time, and offers valuable information to your health care provider. The images from an echocardiogram can provide information such as:  Evidence of coronary artery disease (CAD).  Heart size.  Heart muscle function.  Heart valve function.  Aneurysm detection.  Evidence of a past heart attack.  Fluid buildup around the heart.  Heart muscle thickening.  Assess heart valve function. LET [facility] CARE PROVIDER KNOW ABOUT:  Any allergies you have.  All medicines you are taking, including vitamins, herbs, eye drops, creams, and over-the-counter medicines.  Previous problems you or members of your family have had with the use of anesthetics.  Any blood disorders you have.  Previous surgeries you have had.  Medical conditions you have.  Possibility of pregnancy, if this applies. BEFORE THE PROCEDURE  No special preparation is needed. Eat and drink normally.  PROCEDURE   In order to produce an image of your heart, gel will be applied to your chest and a wand-like tool (transducer) will be moved over your chest. The gel will help transmit the sound waves from the transducer. The sound waves will harmlessly bounce off  your heart to allow the heart images to be captured in real-time motion. These images will then be recorded.  You may need an IV to receive a medicine that improves the quality of the pictures. AFTER THE PROCEDURE You may return to your normal schedule including diet, activities, and medicines, unless your health care provider tells you otherwise.   This information is not intended to replace advice given to you by your health care provider. Make sure you discuss any questions you have with your health care provider.   Document Released: 05/24/2000 Document Revised: 06/17/2014 Document Reviewed: 02/01/2013 Elsevier Interactive Patient Education 2016 [place]. Pharmacologic Stress Electrocardiogram A pharmacologic stress electrocardiogram is a heart (cardiac) test that uses nuclear imaging to evaluate the blood supply to your heart. This test may also be called a pharmacologic stress electrocardiography. Pharmacologic means that a medicine is used to increase your heart rate and blood pressure.  This stress test is done to find areas of poor blood flow to the heart by determining the extent of coronary artery disease (CAD). Some people exercise on a treadmill, which naturally increases the blood flow to the heart. For those people unable to exercise on a treadmill, a medicine is used. This medicine stimulates your heart and will cause your heart to beat harder and more quickly, as if you were exercising.  Pharmacologic stress tests can help determine:  The adequacy of blood flow to your heart during increased levels of activity in order to clear you for discharge home.  The extent of coronary artery blockage caused by CAD.  Your prognosis if you have suffered a heart attack.  The effectiveness of cardiac procedures done, such as an angioplasty, which can increase the circulation in your coronary arteries.  Causes of chest pain or pressure. LET [facility] CARE PROVIDER KNOW ABOUT:  Any  allergies you have.  All medicines you are taking, including vitamins, herbs, eye drops, creams, and over-the-counter medicines.  Previous problems you or members of your family have had with the use of anesthetics.  Any blood disorders you have.  Previous surgeries you have had.  Medical conditions you have.  Possibility of pregnancy, if this applies.  If you are currently breastfeeding. RISKS AND COMPLICATIONS Generally, this is a safe procedure. However, as with any procedure, complications can occur. Possible complications include:  You develop pain or pressure in the following areas:  Chest.  Jaw or neck.  Between your shoulder blades.  Radiating down your left arm.  Headache.  Dizziness or light-headedness.  Shortness of breath.  Increased or irregular heartbeat.  Low blood pressure.  Nausea or vomiting.  Flushing.  Redness going up the arm and slight pain during injection of medicine.  Heart attack (rare). BEFORE THE PROCEDURE   Avoid all forms of caffeine for 24 hours before your test or as directed by your health care provider. This includes coffee, tea (even decaffeinated tea), caffeinated sodas, chocolate, cocoa, and certain pain medicines.  Follow your health care provider's instructions regarding eating and drinking before the test.  Take your medicines as directed at regular times with water unless instructed otherwise. Exceptions may include:  If you have diabetes, ask how you are to take your insulin or pills. It is common to adjust insulin dosing the morning of the test.  If you are taking beta-blocker medicines, it is important to talk to your health care provider about these medicines well before the date of your test. Taking beta-blocker medicines may interfere with the test. In some cases, these medicines need to be changed or stopped 24 hours or more before the test.  If you wear a nitroglycerin patch, it may need to be removed prior to  the test. Ask your health care provider if the patch should be removed before the test.  If you use an inhaler for any breathing condition, bring it with you to the test.  If you are an outpatient, bring a snack so you can eat right after the stress phase of the test.  Do not smoke for 4 hours prior to the test or as directed by your health care provider.  Do not apply lotions, powders, creams, or oils on your chest prior to the test.  Wear comfortable shoes and clothing. Let your health care provider know if you were unable to complete or follow the preparations for your test. PROCEDURE   Multiple patches (electrodes) will be put on your chest. If needed, small areas of your chest may be shaved to get better contact with the electrodes. Once the electrodes are attached to your body, multiple wires will be attached to the electrodes, and your heart rate will be monitored.  An IV access will be started. A nuclear trace (isotope) is given. The isotope may be given intravenously, or it may be swallowed. Nuclear refers to several types of radioactive isotopes, and the nuclear isotope lights up the arteries so that the nuclear images are clear. The isotope is absorbed by your body. This results in low radiation exposure.  A resting nuclear image is taken to show how your heart functions at rest.  A medicine is given through the IV access.  A second scan is done about 1 hour after the medicine injection and determines how your heart functions under stress.  During this stress phase, you will be connected to an electrocardiogram machine. Your blood pressure and oxygen levels will be monitored. AFTER THE PROCEDURE   Your heart rate and blood pressure will be monitored after the test.  You may return to your normal schedule, including diet,activities, and medicines, unless your health care provider tells you otherwise.   This information is not intended to replace advice given to you by your  health care provider. Make sure you discuss any questions you have with your health care provider.   Document Released: 10/13/2008 Document Revised: 06/01/2013 Document Reviewed: 02/01/2013 Elsevier Interactive Patient Education [employer].

## 2016-03-28 NOTE — Progress Notes (Signed)
Cardiology Office Note   Date:  03/28/2016   ID:  Jacob Patterson, DOB 1950-08-22, MRN [id number]  Referring Doctor:  Vernie Murders, PA   Cardiologist:   Wende Bushy, MD   Reason for consultation:  Chief Complaint  Patient presents with  . OTHER    C/o chest pain. Meds reviewed verbally with pt.      History of Present Illness: Jacob Patterson is a 65 y.o. male who presents for Chest pain  Patient presented with new onset, sudden onset chest pain 03/24/2016. He described it as solid pain, moderate in intensity lasting more than 20 minutes, located in the left side of the chest. He got concerned because of a history of PE and therefore presented to the ER for further evaluation. A CT was done and ruled out for PE. He was asked to follow-up with PCP for concerning findings on the lung tissue, question neoplasm. He had a recurrence of the chest pain the following day but none since then.  Patient does have some shortness of breath with minimal to moderate exertion. This has been going on for months. No true progression of symptoms.  Patient denies PND, orthopnea, edema or syncope  ROS:  Please see the history of present illness. Aside from mentioned under HPI, all other systems are reviewed and negative.     Past Medical History:  Diagnosis Date  . Arthritis   . Diverticulosis   . Dizziness    medication related  . Dizziness   . Environmental allergies   . GERD (gastroesophageal reflux disease)   . Hiatal hernia   . HOH (hard of hearing)   . Hypercholesteremia   . Kidney stones   . Murmur   . PE (pulmonary embolism)   . Wheezing     Past Surgical History:  Procedure Laterality Date  . CATARACT EXTRACTION W/PHACO Left 11/23/2015   Procedure: CATARACT EXTRACTION PHACO AND INTRAOCULAR LENS PLACEMENT (IOC);  Surgeon: Eulogio Bear, MD;  Location: [facility] ORS;  Service: Ophthalmology;  Laterality: Left;  Lot # [id number] H [place]: 01:03.8 AP%: 19.5 CDE: 12.46   .  CATARACT EXTRACTION W/PHACO Right 02/29/2016   Procedure: CATARACT EXTRACTION PHACO AND INTRAOCULAR LENS PLACEMENT (IOC);  Surgeon: Eulogio Bear, MD;  Location: [facility] ORS;  Service: Ophthalmology;  Laterality: Right;  [place] 26.4 AP% 14.9 CDE 3.93 Fluid Pack Lot # [id number]  . COLONOSCOPY  2008  . kidney stent    . TONSILLECTOMY    . TONSILLECTOMY AND ADENOIDECTOMY  1960     reports that he has quit smoking. His smoking use included Cigarettes. He has never used smokeless tobacco. He reports that he drinks alcohol. He reports that he does not use drugs.   family history includes Arthritis in his father and mother; Breast cancer in his paternal grandmother; Cataracts in his father; Dementia in his father; Heart attack in his paternal grandfather; Hyperlipidemia in his sister; Stroke in his mother.   Outpatient Medications Prior to Visit  Medication Sig Dispense Refill  . aspirin 81 MG tablet Take 81 mg by mouth daily.    Marland Kitchen B-Complex TABS Take 1 tablet by mouth every morning.    . Cholecalciferol (VITAMIN D3) 2000 UNITS TABS Take 1 tablet by mouth every morning.    . finasteride (PROSCAR) 5 MG tablet Take 5 mg by mouth every morning.    . Melatonin-Pyridoxine (MELATONEX PO) Take 1 tablet by mouth at bedtime. 0.5    . Multiple Vitamins-Minerals (MULTIVITAMIN PO) Take  1 tablet by mouth every morning.    . niacin 500 MG tablet Take 500 mg by mouth at bedtime.    . tamsulosin (FLOMAX) 0.4 MG CAPS capsule Take 1 capsule (0.4 mg total) by mouth daily after supper. 30 capsule 3  . TURMERIC PO Take 1 capsule by mouth every morning.    Marland Kitchen apixaban (ELIQUIS) 5 MG TABS tablet Take 1 tablet (5 mg total) by mouth daily. (Patient not taking: Reported on 03/28/2016) 30 tablet 0  . Magnesium 200 MG TABS Take 1 tablet by mouth 2 (two) times daily.     No facility-administered medications prior to visit.      Allergies: Aleve [naproxen]    PHYSICAL EXAM: VS:  BP 104/70 (BP Location: Right Arm, Patient  Position: Sitting, Cuff Size: Normal)   Pulse 83   Ht 5' 6.5" (1.689 m)   Wt 193 lb 8 oz (87.8 kg)   BMI 30.76 kg/m  , Body mass index is 30.76 kg/m. Wt Readings from Last 3 Encounters:  03/28/16 193 lb 8 oz (87.8 kg)  03/24/16 199 lb (90.3 kg)  02/29/16 192 lb (87.1 kg)    GENERAL:  well developed, well nourished, obese, not in acute distress HEENT: normocephalic, pink conjunctivae, anicteric sclerae, no xanthelasma, normal dentition, oropharynx clear NECK:  no neck vein engorgement, JVP normal, no hepatojugular reflux, carotid upstroke brisk and symmetric, no bruit, no thyromegaly, no lymphadenopathy LUNGS:  good respiratory effort, clear to auscultation bilaterally CV:  PMI not displaced, no thrills, no lifts, S1 and S2 within normal limits, no palpable S3 or S4, no murmurs, no rubs, no gallops ABD:  Soft, nontender, nondistended, normoactive bowel sounds, no abdominal aortic bruit, no hepatomegaly, no splenomegaly MS: nontender back, no kyphosis, no scoliosis, no joint deformities EXT:  2+ DP/PT pulses, no edema, no varicosities, no cyanosis, no clubbing SKIN: warm, nondiaphoretic, normal turgor, no ulcers NEUROPSYCH: alert, oriented to person, place, and time, sensory/motor grossly intact, normal mood, appropriate affect  Recent Labs: 08/11/2015: ALT 16 03/24/2016: BUN 18; Creatinine, Ser 1.41; Hemoglobin 17.6; Platelets 201; Potassium 3.7; Sodium 137   Lipid Panel    Component Value Date/Time   CHOL 208 (H) 07/14/2015 1058   TRIG 68 07/14/2015 1058   HDL 64 07/14/2015 1058   CHOLHDL 3.3 07/14/2015 1058   LDLCALC 130 (H) 07/14/2015 1058     Other studies Reviewed:  EKG:  The ekg from 03/28/2016 was personally reviewed by me and it revealed sinus rhythm, 83 BPM. Poor R-wave progression.  Additional studies/ records that were reviewed personally reviewed by me today include: None available   ASSESSMENT AND PLAN:  Chest pain shortness breath Poor R-wave progression in  EKG Risk factors for CAD include age, smoking history Recommend further evaluation with echocardiogram and stress testing. Patient unable to walk the treadmill due to joint issues, pharmacological stress testing will be ordered. Continue aspirin 81 mg by mouth daily for now. Patient to call 911 for any recurrence of chest pain.  Patient is following up with PCP for abnormal CT scan findings.  Patient received flu shot today.  Current medicines are reviewed at length with the patient today.  The patient does not have concerns regarding medicines.  Labs/ tests ordered today include:  Orders Placed This Encounter  Procedures  . NM Myocar Multi W/Spect W/Wall Motion / EF  . Flu Vaccine QUAD 36+ mos IM  . EKG 12-Lead  . ECHOCARDIOGRAM COMPLETE    I had a lengthy and detailed discussion  with the patient regarding diagnoses, prognosis, diagnostic options, treatment options , and side effects of medications.   I counseled the patient on importance of lifestyle modification including heart healthy diet, regular physical activity Once cardiac workup is completed   Disposition:   FU with undersigned after tests   Signed, Wende Bushy, MD  03/28/2016 3:57 PM    [place]  This note was generated in part with voice recognition software and I apologize for any typographical errors that were not detected and corrected.

## 2016-03-29 DIAGNOSIS — H40009 Preglaucoma, unspecified, unspecified eye: Secondary | ICD-10-CM | POA: Diagnosis not present

## 2016-03-29 DIAGNOSIS — H40003 Preglaucoma, unspecified, bilateral: Secondary | ICD-10-CM | POA: Diagnosis not present

## 2016-04-02 DIAGNOSIS — M1612 Unilateral primary osteoarthritis, left hip: Secondary | ICD-10-CM | POA: Diagnosis not present

## 2016-04-02 DIAGNOSIS — M25552 Pain in left hip: Secondary | ICD-10-CM | POA: Diagnosis not present

## 2016-04-02 DIAGNOSIS — Z6831 Body mass index (BMI) 31.0-31.9, adult: Secondary | ICD-10-CM | POA: Diagnosis not present

## 2016-04-02 DIAGNOSIS — J9859 Other diseases of mediastinum, not elsewhere classified: Secondary | ICD-10-CM | POA: Diagnosis not present

## 2016-04-02 DIAGNOSIS — R918 Other nonspecific abnormal finding of lung field: Secondary | ICD-10-CM | POA: Diagnosis not present

## 2016-04-03 ENCOUNTER — Inpatient Hospital Stay: Payer: Medicare Other | Admitting: Hematology and Oncology

## 2016-04-03 DIAGNOSIS — R590 Localized enlarged lymph nodes: Secondary | ICD-10-CM | POA: Insufficient documentation

## 2016-04-03 NOTE — Progress Notes (Deleted)
[facility] day:  04/03/2016  Chief Complaint: Jacob Patterson is a 65 y.o. male with mediastinal and hilar adenopathy who is referred by Dr. Lurline Hare in consultation for assessment and management.  HPI:  The patient presented to the emergency room at [facility] on 03/24/2016 with left sided chest pain.  EKG and troponins were negative.  Chest x-ray revealed a focal right lung base density concerning for developing infiltrate.   He underwent chest CT angiogram secondary to a history of pulmonary embolism in 2016.  Eliquis was discontinued in 09/2015  Chest CT angiogram revealed no evidence of pulmonary emboli. There was mediastinal and left hilar adenopathy associated with nodular densities in the right upper lobe, right middle lobe and possibly subpleural left upper lobe largest up to 9 mm. Findings were concerning for neoplasm.  Right upper paratracheal adenopathy was 1.7 cm adjacent to a smaller 1.1 cm lymph node and precarinal 2.1 cm lymph node. There was a spiculated right upper lobe pulmonary nodule measuring 0.8 cm (new). There were subpleural densities in the left upper lobe measuring 3-4 mm. There was a right middle lobe lateral segment ovoid density measuring 9 mm x 6 mm.  Findings were concerning for neoplasm.  Past Medical History:  Diagnosis Date  . Arthritis   . Diverticulosis   . Dizziness    medication related  . Dizziness   . Environmental allergies   . GERD (gastroesophageal reflux disease)   . Hiatal hernia   . HOH (hard of hearing)   . Hypercholesteremia   . Kidney stones   . Murmur   . PE (pulmonary embolism)   . Wheezing     Past Surgical History:  Procedure Laterality Date  . CATARACT EXTRACTION W/PHACO Left 11/23/2015   Procedure: CATARACT EXTRACTION PHACO AND INTRAOCULAR LENS PLACEMENT (IOC);  Surgeon: Eulogio Bear, MD;  Location: [facility] ORS;  Service: Ophthalmology;  Laterality: Left;  Lot # [id number] H [place]: 01:03.8 AP%:  19.5 CDE: 12.46   . CATARACT EXTRACTION W/PHACO Right 02/29/2016   Procedure: CATARACT EXTRACTION PHACO AND INTRAOCULAR LENS PLACEMENT (IOC);  Surgeon: Eulogio Bear, MD;  Location: [facility] ORS;  Service: Ophthalmology;  Laterality: Right;  [place] 26.4 AP% 14.9 CDE 3.93 Fluid Pack Lot # [id number]  . COLONOSCOPY  2008  . kidney stent    . TONSILLECTOMY    . TONSILLECTOMY AND ADENOIDECTOMY  1960    Family History  Problem Relation Age of Onset  . Arthritis Mother   . Stroke Mother   . Arthritis Father   . Dementia Father   . Cataracts Father   . Hyperlipidemia Sister   . Breast cancer Paternal Grandmother   . Heart attack Paternal Grandfather     Social History:  reports that he has quit smoking. His smoking use included Cigarettes. He has never used smokeless tobacco. He reports that he drinks alcohol. He reports that he does not use drugs.  The patient is accompanied by *** alone today.  Allergies:  Allergies  Allergen Reactions  . Aleve [Naproxen]     Current Medications: Current Outpatient Prescriptions  Medication Sig Dispense Refill  . aspirin 81 MG tablet Take 81 mg by mouth daily.    Marland Kitchen B-Complex TABS Take 1 tablet by mouth every morning.    . Cholecalciferol (VITAMIN D3) 2000 UNITS TABS Take 1 tablet by mouth every morning.    . finasteride (PROSCAR) 5 MG tablet Take 5 mg by mouth every morning.    Marland Kitchen  MAGNESIUM PO Take by mouth daily.    . Melatonin-Pyridoxine (MELATONEX PO) Take 1 tablet by mouth at bedtime. 0.5    . Multiple Vitamins-Minerals (MULTIVITAMIN PO) Take 1 tablet by mouth every morning.    . niacin 500 MG tablet Take 500 mg by mouth at bedtime.    . tamsulosin (FLOMAX) 0.4 MG CAPS capsule Take 1 capsule (0.4 mg total) by mouth daily after supper. 30 capsule 3  . TURMERIC PO Take 1 capsule by mouth every morning.     No current facility-administered medications for this visit.     Review of Systems:  GENERAL:  Feels good.  Active.  No fevers, sweats or  weight loss. PERFORMANCE STATUS (ECOG):  *** HEENT:  No visual changes, runny nose, sore throat, mouth sores or tenderness. Lungs: No shortness of breath or cough.  No hemoptysis. Cardiac:  No chest pain, palpitations, orthopnea, or PND. GI:  No nausea, vomiting, diarrhea, constipation, melena or hematochezia. GU:  No urgency, frequency, dysuria, or hematuria. Musculoskeletal:  No back pain.  No joint pain.  No muscle tenderness. Extremities:  No pain or swelling. Skin:  No rashes or skin changes. Neuro:  No headache, numbness or weakness, balance or coordination issues. Endocrine:  No diabetes, thyroid issues, hot flashes or night sweats. Psych:  No mood changes, depression or anxiety. Pain:  No focal pain. Review of systems:  All other systems reviewed and found to be negative.   Physical Exam: There were no vitals taken for this visit. GENERAL:  Well developed, well nourished, sitting comfortably in the exam room in no acute distress. MENTAL STATUS:  Alert and oriented to person, place and time. HEAD:  *** hair.  Normocephalic, atraumatic, face symmetric, no Cushingoid features. EYES:  *** eyes.  Pupils equal round and reactive to light and accomodation.  No conjunctivitis or scleral icterus. ENT:  Oropharynx clear without lesion.  Tongue normal. Mucous membranes moist.  RESPIRATORY:  Clear to auscultation without rales, wheezes or rhonchi. CARDIOVASCULAR:  Regular rate and rhythm without murmur, rub or gallop. ABDOMEN:  Soft, non-tender, with active bowel sounds, and no hepatosplenomegaly.  No masses. SKIN:  No rashes, ulcers or lesions. EXTREMITIES: No edema, no skin discoloration or tenderness.  No palpable cords. LYMPH NODES: No palpable cervical, supraclavicular, axillary or inguinal adenopathy  NEUROLOGICAL: Unremarkable. PSYCH:  Appropriate.  No visits with results within 3 Day(s) from this visit.  Latest known visit with results is:  Admission on 03/24/2016, Discharged  on 03/25/2016  Component Date Value Ref Range Status  . Sodium 03/24/2016 137  135 - 145 mmol/L Final  . Potassium 03/24/2016 3.7  3.5 - 5.1 mmol/L Final  . Chloride 03/24/2016 106  101 - 111 mmol/L Final  . CO2 03/24/2016 25  22 - 32 mmol/L Final  . Glucose, Bld 03/24/2016 92  65 - 99 mg/dL Final  . BUN 03/24/2016 18  6 - 20 mg/dL Final  . Creatinine, Ser 03/24/2016 1.41* 0.61 - 1.24 mg/dL Final  . Calcium 03/24/2016 9.6  8.9 - 10.3 mg/dL Final  . GFR calc non Af Amer 03/24/2016 50* >60 mL/min Final  . GFR calc Af Amer 03/24/2016 58* >60 mL/min Final   Comment: (NOTE) The eGFR has been calculated using the CKD EPI equation. This calculation has not been validated in all clinical situations. eGFR's persistently <60 mL/min signify possible Chronic Kidney Disease.   . Anion gap 03/24/2016 6  5 - 15 Final  . WBC 03/24/2016 12.8* 3.8 - 10.6  K/uL Final  . RBC 03/24/2016 5.67  4.40 - 5.90 MIL/uL Final  . Hemoglobin 03/24/2016 17.6  13.0 - 18.0 g/dL Final  . HCT 03/24/2016 51.2  40.0 - 52.0 % Final  . MCV 03/24/2016 90.4  80.0 - 100.0 fL Final  . MCH 03/24/2016 31.0  26.0 - 34.0 pg Final  . MCHC 03/24/2016 34.3  32.0 - 36.0 g/dL Final  . RDW 03/24/2016 13.5  11.5 - 14.5 % Final  . Platelets 03/24/2016 201  150 - 440 K/uL Final  . Troponin I 03/24/2016 <0.03  <0.03 ng/mL Final  . Troponin I 03/25/2016 <0.03  <0.03 ng/mL Final    Assessment:  THEADORE BLUNCK is a 65 y.o. male ***  Plan: 1. *** 2. *** 3. *** 4. *** 5. ***  Lequita Asal, MD  04/03/2016, 5:58 AM

## 2016-04-08 DIAGNOSIS — M1612 Unilateral primary osteoarthritis, left hip: Secondary | ICD-10-CM | POA: Diagnosis not present

## 2016-04-08 DIAGNOSIS — M25552 Pain in left hip: Secondary | ICD-10-CM | POA: Diagnosis not present

## 2016-04-09 ENCOUNTER — Ambulatory Visit (INDEPENDENT_AMBULATORY_CARE_PROVIDER_SITE_OTHER): Payer: Medicare Other | Admitting: Family Medicine

## 2016-04-09 ENCOUNTER — Encounter: Payer: Self-pay | Admitting: Family Medicine

## 2016-04-09 ENCOUNTER — Encounter: Payer: Self-pay | Admitting: Hematology and Oncology

## 2016-04-09 VITALS — BP 102/76 | HR 60 | Temp 98.1°F | Resp 14 | Wt 197.4 lb

## 2016-04-09 DIAGNOSIS — R590 Localized enlarged lymph nodes: Secondary | ICD-10-CM

## 2016-04-09 DIAGNOSIS — K449 Diaphragmatic hernia without obstruction or gangrene: Secondary | ICD-10-CM | POA: Diagnosis not present

## 2016-04-09 DIAGNOSIS — N200 Calculus of kidney: Secondary | ICD-10-CM

## 2016-04-09 DIAGNOSIS — R0789 Other chest pain: Secondary | ICD-10-CM

## 2016-04-09 NOTE — Progress Notes (Signed)
Patient: Jacob Patterson Male    DOB: Oct 07, 1950   65 y.o.   MRN: [id number] Visit Date: 04/09/2016  Today's Provider: Vernie Murders, PA   Chief Complaint  Patient presents with  . Hospitalization Follow-up  . Follow-up    labs   Subjective:    HPI Patient is here today to follow up kidney and liver function and a hospital follow up . Last office visit was 11/21/2015 and patient was encouraged to increase water intake with diet. Last labs were done on 11/22/2015.    Follow up Hospitalization  Patient was admitted to [facility] on 03/24/2016 and discharged on 03/25/2016. He was treated for nonspecific chest pain. Treatment for this included no change in medication. Patient was advised to follow up with cardiologist. Patient reports he has a stress test scheduled for tomorrow. He reports excellent compliance with treatment. He reports this condition is Improved.  ------------------------------------------------------------------------------------   Patient Active Problem List   Diagnosis Date Noted  . Hilar adenopathy 04/03/2016  . Mediastinal adenopathy 03/24/2016  . Nodule of right lung 03/24/2016  . Elevated prostate specific antigen (PSA) 03/27/2015  . Hx of pulmonary infarction 03/27/2015  . Arthritis 10/31/2014  . 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 D-dimer, elevated 10/31/2014  . Abnormal prostate specific antigen 10/31/2014  . Acid reflux 10/31/2014  . Hypercholesteremia 10/31/2014  . Hernia, inguinal 10/31/2014  . Arthralgia of hip 10/31/2014  . Awareness of heartbeats 10/31/2014  . Pleural cavity effusion 10/31/2014  . Phlebitis after infusion 10/31/2014  . Calculus of kidney 10/31/2014  . CD (contact dermatitis) 02/01/2008  . Excessive urination at night 05/14/2007  . Can't get food down 04/11/2006   Past Surgical History:  Procedure Laterality Date  . CATARACT EXTRACTION W/PHACO Left 11/23/2015   Procedure: CATARACT EXTRACTION PHACO AND INTRAOCULAR LENS  PLACEMENT (IOC);  Surgeon: Eulogio Bear, MD;  Location: [facility] ORS;  Service: Ophthalmology;  Laterality: Left;  Lot # [id number] H [place]: 01:03.8 AP%: 19.5 CDE: 12.46   . CATARACT EXTRACTION W/PHACO Right 02/29/2016   Procedure: CATARACT EXTRACTION PHACO AND INTRAOCULAR LENS PLACEMENT (IOC);  Surgeon: Eulogio Bear, MD;  Location: [facility] ORS;  Service: Ophthalmology;  Laterality: Right;  [place] 26.4 AP% 14.9 CDE 3.93 Fluid Pack Lot # [id number]  . COLONOSCOPY  2008  . kidney stent    . TONSILLECTOMY    . TONSILLECTOMY AND ADENOIDECTOMY  1960   Family History  Problem Relation Age of Onset  . Arthritis Mother   . Stroke Mother   . Arthritis Father   . Dementia Father   . Cataracts Father   . Hyperlipidemia Sister   . Breast cancer Paternal Grandmother   . Heart attack Paternal Grandfather    Allergies  Allergen Reactions  . Aleve [Naproxen]      Previous Medications   ACETAMINOPHEN (TYLENOL) 500 MG TABLET    Take 1,000 mg by mouth.   ASPIRIN 81 MG TABLET    Take 81 mg by mouth daily.   B-COMPLEX TABS    Take 1 tablet by mouth every morning.   CHOLECALCIFEROL (VITAMIN D3) 2000 UNITS TABS    Take 1 tablet by mouth every morning.   FINASTERIDE (PROSCAR) 5 MG TABLET    Take 5 mg by mouth every morning.   MAGNESIUM PO    Take by mouth daily.   MELATONIN-PYRIDOXINE (MELATONEX PO)    Take 1 tablet by mouth at bedtime. 0.5   MULTIPLE VITAMINS-MINERALS (MULTIVITAMIN PO)    Take 1 tablet  by mouth every morning.   NIACIN 500 MG TABLET    Take 500 mg by mouth at bedtime.   TAMSULOSIN (FLOMAX) 0.4 MG CAPS CAPSULE    Take 1 capsule (0.4 mg total) by mouth daily after supper.   TURMERIC PO    Take 1 capsule by mouth every morning.    Review of Systems  Constitutional: Negative.   Respiratory: Negative.   Cardiovascular: Negative.     Social History  Substance Use Topics  . Smoking status: Former Smoker    Types: Cigarettes  . Smokeless tobacco: Never Used     Comment: Quit in 1989  .  Alcohol use 0.0 oz/week     Comment: occasionally drinks wine or beer   Objective:   BP 102/76 (BP Location: Right Arm, Patient Position: Sitting, Cuff Size: Normal)   Pulse 60   Temp 98.1 F (36.7 C) (Oral)   Resp 14   Wt 197 lb 6.4 oz (89.5 kg)   BMI 31.38 kg/m   Physical Exam  Constitutional: He is oriented to person, place, and time. He appears well-developed and well-nourished. No distress.  HENT:  Head: Normocephalic and atraumatic.  Right Ear: Hearing normal.  Left Ear: Hearing normal.  Nose: Nose normal.  Eyes: Conjunctivae and lids are normal. Right eye exhibits no discharge. Left eye exhibits no discharge. No scleral icterus.  Cardiovascular: Normal rate and regular rhythm.   Pulmonary/Chest: Effort normal and breath sounds normal. No respiratory distress.  Abdominal: Soft. Bowel sounds are normal.  Musculoskeletal: Normal range of motion.  Neurological: He is alert and oriented to person, place, and time.  Skin: Skin is intact. No lesion and no rash noted.  Psychiatric: He has a normal mood and affect. His speech is normal and behavior is normal. Thought content normal.      Assessment & Plan:     1. Atypical chest pain Hospital evaluation 03-24-16 was negative for PE and MI. Small hiatal hernia and 2 mm ureteral stone with mild hydronephrosis seen on CT of abdomen/pelvis. Chest CT showed pulmonary nodules and mediastinal adenopathy. No further chest discomfort. Will be getting stress test with cardiologist (Dr. Yvone Neu) tomorrow. Recheck labs today and follow up pending reports from labs and diagnostic tests. - CBC with Differential/Platelet  2. Mediastinal adenopathy Scheduled for PET scan 04-11-16 at [facility] oncology clinic to evaluate mediastinal and left hilar lymphadenopathy with pulmonary nodules RUL, RML and LUL concerning for neoplasm. No dyspnea, fatigue or chest discomfort today. Recheck labs and follow up with oncologist as planned. - CBC with  Differential/Platelet - Comprehensive metabolic panel  3. Hiatal hernia Small sliding hiatal hernia seen on CT scan of abdomen/pelvis. Denies hematemesis, melena, hemoptysis, cough or significant dyspepsia. May use antacid as needed. - CBC with Differential/Platelet  4. Kidney stone on left side History of renal stone in 2004 requiring a stent and followed by Dr. Jacqlyn Larsen (urologist). CT scan of abdomen/pelvis showed a 2 mm left ureteral stone with mild hydronephrosis on 03-24-16. No hematuria or recurrence of left lower chest pain. Still on Flomax for BPH and encouraged to drink extra fluids. May need follow up with Dr. Jacqlyn Larsen if he can't pass this stone easily. Recheck labs today. - CBC with Differential/Platelet - Comprehensive metabolic panel

## 2016-04-10 ENCOUNTER — Encounter
Admission: RE | Admit: 2016-04-10 | Discharge: 2016-04-10 | Disposition: A | Discharge status: Home / Self Care | Payer: Medicare Other | Source: Ambulatory Visit | Attending: Cardiology | Admitting: Cardiology

## 2016-04-10 DIAGNOSIS — R9431 Abnormal electrocardiogram [ECG] [EKG]: Secondary | ICD-10-CM

## 2016-04-10 DIAGNOSIS — I510 Cardiac septal defect, acquired: Secondary | ICD-10-CM | POA: Insufficient documentation

## 2016-04-10 DIAGNOSIS — Z136 Encounter for screening for cardiovascular disorders: Secondary | ICD-10-CM

## 2016-04-10 DIAGNOSIS — R0602 Shortness of breath: Secondary | ICD-10-CM

## 2016-04-10 DIAGNOSIS — R0789 Other chest pain: Secondary | ICD-10-CM

## 2016-04-10 LAB — NM MYOCAR MULTI W/SPECT W/WALL MOTION / EF
CHL CUP NUCLEAR SDS: 6
CSEPHR: 67 %
CSEPPHR: 104 {beats}/min
LVDIAVOL: 47 mL (ref 62–150)
LVSYSVOL: 13 mL
Rest HR: 66 {beats}/min
SRS: 0
SSS: 1
TID: 1

## 2016-04-10 LAB — COMPREHENSIVE METABOLIC PANEL
A/G RATIO: 1.5 (ref 1.2–2.2)
ALT: 16 IU/L (ref 0–44)
AST: 20 IU/L (ref 0–40)
Albumin: 4.1 g/dL (ref 3.6–4.8)
Alkaline Phosphatase: 78 IU/L (ref 39–117)
BUN/Creatinine Ratio: 17 (ref 10–24)
BUN: 23 mg/dL (ref 8–27)
Bilirubin Total: 0.4 mg/dL (ref 0.0–1.2)
CALCIUM: 9.3 mg/dL (ref 8.6–10.2)
CO2: 23 mmol/L (ref 18–29)
CREATININE: 1.38 mg/dL — AB (ref 0.76–1.27)
Chloride: 103 mmol/L (ref 96–106)
GFR, EST AFRICAN AMERICAN: 61 mL/min/{1.73_m2} (ref >59)
GFR, EST NON AFRICAN AMERICAN: 53 mL/min/{1.73_m2} — AB (ref >59)
GLOBULIN, TOTAL: 2.8 g/dL (ref 1.5–4.5)
Glucose: 99 mg/dL (ref 65–99)
POTASSIUM: 4.3 mmol/L (ref 3.5–5.2)
SODIUM: 143 mmol/L (ref 134–144)
TOTAL PROTEIN: 6.9 g/dL (ref 6.0–8.5)

## 2016-04-10 LAB — CBC WITH DIFFERENTIAL/PLATELET
BASOS: 1 %
Basophils Absolute: 0.1 10*3/uL (ref 0.0–0.2)
EOS (ABSOLUTE): 0.7 10*3/uL — ABNORMAL HIGH (ref 0.0–0.4)
EOS: 7 %
HEMATOCRIT: 48.8 % (ref 37.5–51.0)
Hemoglobin: 17 g/dL (ref 12.6–17.7)
IMMATURE GRANS (ABS): 0.1 10*3/uL (ref 0.0–0.1)
IMMATURE GRANULOCYTES: 1 %
LYMPHS: 30 %
Lymphocytes Absolute: 2.8 10*3/uL (ref 0.7–3.1)
MCH: 31.4 pg (ref 26.6–33.0)
MCHC: 34.8 g/dL (ref 31.5–35.7)
MCV: 90 fL (ref 79–97)
MONOCYTES: 11 %
Monocytes Absolute: 1 10*3/uL — ABNORMAL HIGH (ref 0.1–0.9)
NEUTROS PCT: 50 %
Neutrophils Absolute: 4.6 10*3/uL (ref 1.4–7.0)
Platelets: 283 10*3/uL (ref 150–379)
RBC: 5.42 x10E6/uL (ref 4.14–5.80)
RDW: 13.4 % (ref 12.3–15.4)
WBC: 9.3 10*3/uL (ref 3.4–10.8)

## 2016-04-10 MED ORDER — REGADENOSON 0.4 MG/5ML IV SOLN
0.4000 mg | Freq: Once | INTRAVENOUS | Status: AC
  Administered 2016-04-10: 0.4 mg via INTRAVENOUS

## 2016-04-10 MED ORDER — TECHNETIUM TC 99M TETROFOSMIN IV KIT
13.0000 | PACK | Freq: Once | INTRAVENOUS | Status: AC | PRN
  Administered 2016-04-10: 13.574 via INTRAVENOUS

## 2016-04-10 MED ORDER — TECHNETIUM TC 99M TETROFOSMIN IV KIT
32.7840 | PACK | Freq: Once | INTRAVENOUS | Status: AC | PRN
  Administered 2016-04-10: 32.784 via INTRAVENOUS

## 2016-04-11 ENCOUNTER — Telehealth: Payer: Self-pay

## 2016-04-11 DIAGNOSIS — N200 Calculus of kidney: Secondary | ICD-10-CM | POA: Diagnosis not present

## 2016-04-11 DIAGNOSIS — J9859 Other diseases of mediastinum, not elsewhere classified: Secondary | ICD-10-CM | POA: Diagnosis not present

## 2016-04-11 DIAGNOSIS — R911 Solitary pulmonary nodule: Secondary | ICD-10-CM | POA: Diagnosis not present

## 2016-04-11 DIAGNOSIS — R918 Other nonspecific abnormal finding of lung field: Secondary | ICD-10-CM | POA: Diagnosis not present

## 2016-04-11 NOTE — Telephone Encounter (Signed)
-----   Message from Margo Common, [place] sent at 04/10/2016  5:51 PM EDT ----- Blood cell counts back to normal. Creatinine still shows some elevation but slightly better. Proceed with oncology evaluation and the oncologist may want you to see urologist pending their PET scan report.

## 2016-04-11 NOTE — Telephone Encounter (Signed)
Patient has been advised. KW 

## 2016-04-23 ENCOUNTER — Ambulatory Visit (INDEPENDENT_AMBULATORY_CARE_PROVIDER_SITE_OTHER): Payer: Medicare Other | Admitting: Family Medicine

## 2016-04-23 ENCOUNTER — Encounter: Payer: Self-pay | Admitting: Family Medicine

## 2016-04-23 ENCOUNTER — Other Ambulatory Visit: Payer: Self-pay

## 2016-04-23 ENCOUNTER — Ambulatory Visit (INDEPENDENT_AMBULATORY_CARE_PROVIDER_SITE_OTHER): Payer: Medicare Other

## 2016-04-23 VITALS — BP 120/80 | HR 72 | Temp 97.7°F | Wt 198.0 lb

## 2016-04-23 DIAGNOSIS — R590 Localized enlarged lymph nodes: Secondary | ICD-10-CM | POA: Diagnosis not present

## 2016-04-23 DIAGNOSIS — Z7982 Long term (current) use of aspirin: Secondary | ICD-10-CM | POA: Diagnosis not present

## 2016-04-23 DIAGNOSIS — Z79899 Other long term (current) drug therapy: Secondary | ICD-10-CM | POA: Diagnosis not present

## 2016-04-23 DIAGNOSIS — Z86711 Personal history of pulmonary embolism: Secondary | ICD-10-CM | POA: Diagnosis not present

## 2016-04-23 DIAGNOSIS — I871 Compression of vein: Secondary | ICD-10-CM | POA: Diagnosis not present

## 2016-04-23 DIAGNOSIS — I82611 Acute embolism and thrombosis of superficial veins of right upper extremity: Secondary | ICD-10-CM | POA: Diagnosis not present

## 2016-04-23 DIAGNOSIS — K228 Other specified diseases of esophagus: Secondary | ICD-10-CM | POA: Diagnosis not present

## 2016-04-23 DIAGNOSIS — R9431 Abnormal electrocardiogram [ECG] [EKG]: Secondary | ICD-10-CM | POA: Diagnosis not present

## 2016-04-23 DIAGNOSIS — Z87891 Personal history of nicotine dependence: Secondary | ICD-10-CM | POA: Diagnosis not present

## 2016-04-23 DIAGNOSIS — R0602 Shortness of breath: Secondary | ICD-10-CM | POA: Diagnosis not present

## 2016-04-23 DIAGNOSIS — T801XXA Vascular complications following infusion, transfusion and therapeutic injection, initial encounter: Secondary | ICD-10-CM

## 2016-04-23 DIAGNOSIS — Z136 Encounter for screening for cardiovascular disorders: Secondary | ICD-10-CM

## 2016-04-23 DIAGNOSIS — R0789 Other chest pain: Secondary | ICD-10-CM | POA: Diagnosis not present

## 2016-04-23 DIAGNOSIS — Z6831 Body mass index (BMI) 31.0-31.9, adult: Secondary | ICD-10-CM | POA: Diagnosis not present

## 2016-04-23 DIAGNOSIS — Z886 Allergy status to analgesic agent status: Secondary | ICD-10-CM | POA: Diagnosis not present

## 2016-04-23 DIAGNOSIS — Z8709 Personal history of other diseases of the respiratory system: Secondary | ICD-10-CM | POA: Diagnosis not present

## 2016-04-23 DIAGNOSIS — K219 Gastro-esophageal reflux disease without esophagitis: Secondary | ICD-10-CM | POA: Diagnosis not present

## 2016-04-23 DIAGNOSIS — I2699 Other pulmonary embolism without acute cor pulmonale: Secondary | ICD-10-CM | POA: Diagnosis not present

## 2016-04-23 DIAGNOSIS — I809 Phlebitis and thrombophlebitis of unspecified site: Principal | ICD-10-CM

## 2016-04-23 DIAGNOSIS — R918 Other nonspecific abnormal finding of lung field: Secondary | ICD-10-CM | POA: Diagnosis not present

## 2016-04-23 DIAGNOSIS — Z8719 Personal history of other diseases of the digestive system: Secondary | ICD-10-CM | POA: Diagnosis not present

## 2016-04-23 MED ORDER — APIXABAN 5 MG PO TABS: 5.0000 mg | ORAL_TABLET | Freq: Two times a day (BID) | ORAL | 3 refills | Status: DC

## 2016-04-23 NOTE — Progress Notes (Signed)
Patient: Jacob Patterson Male    DOB: 1951-02-02   65 y.o.   MRN: [id number] Visit Date: 04/23/2016  Today's Provider: Vernie Murders, PA   Chief Complaint  Patient presents with  . Arm Pain   Subjective:    HPI Patient is here today with concerns of a raised vein in right arm. Patient reports having a PET scan and stress test done with contrast on 11/1 and 11/2. He said he then noticed the "enlarged vein". Patient was seen at [facility] this morning and the provider there ordered a ultrasound. Per patient ultrasound results showed a slight blockage in right arm.   Past Medical History:  Diagnosis Date  . Arthritis   . Diverticulosis   . Dizziness    medication related  . Dizziness   . Environmental allergies   . GERD (gastroesophageal reflux disease)   . Hiatal hernia   . HOH (hard of hearing)   . Hypercholesteremia   . Kidney stones   . Murmur   . PE (pulmonary embolism)   . Wheezing    Past Surgical History:  Procedure Laterality Date  . CATARACT EXTRACTION W/PHACO Left 11/23/2015   Procedure: CATARACT EXTRACTION PHACO AND INTRAOCULAR LENS PLACEMENT (IOC);  Surgeon: Eulogio Bear, MD;  Location: [facility] ORS;  Service: Ophthalmology;  Laterality: Left;  Lot # [id number] H [place]: 01:03.8 AP%: 19.5 CDE: 12.46   . CATARACT EXTRACTION W/PHACO Right 02/29/2016   Procedure: CATARACT EXTRACTION PHACO AND INTRAOCULAR LENS PLACEMENT (IOC);  Surgeon: Eulogio Bear, MD;  Location: [facility] ORS;  Service: Ophthalmology;  Laterality: Right;  [place] 26.4 AP% 14.9 CDE 3.93 Fluid Pack Lot # [id number]  . COLONOSCOPY  2008  . kidney stent    . TONSILLECTOMY    . TONSILLECTOMY AND ADENOIDECTOMY  1960   Family History  Problem Relation Age of Onset  . Arthritis Mother   . Stroke Mother   . Arthritis Father   . Dementia Father   . Cataracts Father   . Hyperlipidemia Sister   . Breast cancer Paternal Grandmother   . Heart attack Paternal Grandfather    Allergies  Allergen Reactions    . Aleve [Naproxen]      Previous Medications   ACETAMINOPHEN (TYLENOL) 500 MG TABLET    Take 1,000 mg by mouth.   ASPIRIN 81 MG TABLET    Take 81 mg by mouth daily.   B-COMPLEX TABS    Take 1 tablet by mouth every morning.   CHOLECALCIFEROL (VITAMIN D3) 2000 UNITS TABS    Take 1 tablet by mouth every morning.   FINASTERIDE (PROSCAR) 5 MG TABLET    Take 5 mg by mouth every morning.   MAGNESIUM PO    Take by mouth daily.   MELATONIN-PYRIDOXINE (MELATONEX PO)    Take 1 tablet by mouth at bedtime. 0.5   MULTIPLE VITAMINS-MINERALS (MULTIVITAMIN PO)    Take 1 tablet by mouth every morning.   NIACIN 500 MG TABLET    Take 500 mg by mouth at bedtime.   TAMSULOSIN (FLOMAX) 0.4 MG CAPS CAPSULE    Take 1 capsule (0.4 mg total) by mouth daily after supper.   TURMERIC PO    Take 1 capsule by mouth every morning.    Review of Systems  Constitutional: Negative.   Respiratory: Negative.   Cardiovascular: Negative.     Social History  Substance Use Topics  . Smoking status: Former Smoker    Types: Cigarettes  . Smokeless tobacco: Never Used  Comment: Quit in 1989  . Alcohol use 0.0 oz/week     Comment: occasionally drinks wine or beer   Objective:   BP 120/80 (BP Location: Right Arm, Patient Position: Sitting, Cuff Size: Normal)   Pulse 72   Temp 97.7 F (36.5 C) (Oral)   Wt 198 lb (89.8 kg)   BMI 31.48 kg/m   Physical Exam  Constitutional: He is oriented to person, place, and time. He appears well-developed and well-nourished.  HENT:  Head: Normocephalic.  Eyes: Conjunctivae are normal.  Neck: Neck supple.  Cardiovascular: Normal rate and regular rhythm.   Pulmonary/Chest: Effort normal and breath sounds normal.  Abdominal: Soft.  Musculoskeletal:  Soreness along the vein in the left antecubital fossa up the inner arm where IV contrast was infused for a PET scan to check pulmonary nodules 2 weeks ago. No swelling but shotty lumps noted in the vein with ecchymotic skin changes  in that area.  Neurological: He is alert and oriented to person, place, and time.      Assessment & Plan:     1. Phlebitis after infusion, initial encounter Had PET scan with contrast infused in the right arm on 04-11-16 (scan done to evaluate pulmonary nodules seen on CT during ER visit on 03-24-16 for chest pain - no PE seen). Nodules seemed to resolve but discoloration and soreness in the right antecubital fossa vein has developed. Preliminary report from Venous Duplex showed acute right arm superficial venous obstruction in the basilic vein today. Patient very worried about another possible Pe as this was the same scenario when he had a pulmonary infarct after contrast infusion in the left antecubital fossa on 10-13-14. Wants to get back on the Eliquis 5 mg BID (given 1 week sample) until final report from oncologist at [facility] (Dr. Odetta Pink) received. Will get CBC, protime and D-Dimer and follow up pending reports. - CBC with Differential/Platelet - INR/PT - D-Dimer, Quantitative  2. Hx of pulmonary infarction Onset in 2016 and was on Eliquis for one year. Kept on ASA daily since that time. Recheck labs with recent phlebitis after IV contrast infusion on 04-11-16. No dyspnea or chest pains recently. - CBC with Differential/Platelet - INR/PT - D-Dimer, Quantitative

## 2016-04-23 NOTE — Patient Instructions (Signed)
Phlebitis Phlebitis is soreness and swelling (inflammation) of a vein. This can occur in your arms, legs, or torso (trunk), as well as deeper inside your body. Phlebitis is usually not serious when it occurs close to the surface of the body. However, it can cause serious problems when it occurs in a vein deeper inside the body. What are the causes? Phlebitis can be triggered by various things, including:  Reduced blood flow through your veins. This can happen with: ? Bed rest over a long period. ? Long-distance travel. ? Injury. ? Surgery. ? Being overweight (obese) or pregnant.  Having an IV tube put in the vein and getting certain medicines through the vein.  Cancer and cancer treatment.  Use of illegal drugs taken through the vein.  Inflammatory diseases.  Inherited (genetic) diseases that increase the risk of blood clots.  Hormone therapy, such as birth control pills.  What are the signs or symptoms?  Red, tender, swollen, and painful area on your skin. Usually, the area will be long and narrow.  Firmness along the center of the affected area. This can indicate that a blood clot has formed.  Low-grade fever. How is this diagnosed? A health care provider can usually diagnose phlebitis by examining the affected area and asking about your symptoms. To check for infection or blood clots, your health care provider may order blood tests or an ultrasound exam of the area. Blood tests and your family history may also indicate if you have an underlying genetic disease that causes blood clots. Occasionally, a piece of tissue is taken from the body (biopsy sample) if an unusual cause of phlebitis is suspected. How is this treated? Treatment will vary depending on the severity of the condition and the area of the body affected. Treatment may include:  Use of a warm compress or heating pad.  Use of compression stockings or bandages.  Anti-inflammatory medicines.  Removal of any IV  tube that may be causing the problem.  Medicines that kill germs (antibiotics) if an infection is present.  Blood-thinning medicines if a blood clot is suspected or present.  In rare cases, surgery may be needed to remove damaged sections of vein.  Follow these instructions at home:  Only take over-the-counter or prescription medicines as directed by your health care provider. Take all medicines exactly as prescribed.  Raise (elevate) the affected area above the level of your heart as directed by your health care provider.  Apply a warm compress or heating pad to the affected area as directed by your health care provider. Do not sleep with the heating pad.  Use compression stockings or bandages as directed. These will speed healing and prevent the condition from coming back.  If you are on blood thinners: ? Get follow-up blood tests as directed by your health care provider. ? Check with your health care provider before using any new medicines. ? Carry a medical alert card or wear your medical alert jewelry to show that you are on blood thinners.  For phlebitis in the legs: ? Avoid prolonged standing or bed rest. ? Keep your legs moving. Raise your legs when sitting or lying.  Do not smoke.  Women, particularly those over the age of 35, should consider the risks and benefits of taking the contraceptive pill. This kind of hormone treatment can increase your risk for blood clots.  Follow up with your health care provider as directed. Contact a health care provider if:  You have unusual bruising or any   bleeding problems.  Your swelling or pain in the affected area is not improving.  You are on anti-inflammatory medicine, and you develop belly (abdominal) pain. Get help right away if:  You have a sudden onset of chest pain or difficulty breathing.  You have a fever or persistent symptoms for more than 2-3 days.  You have a fever and your symptoms suddenly get worse. This  information is not intended to replace advice given to you by your health care provider. Make sure you discuss any questions you have with your health care provider. Document Released: 05/21/2001 Document Revised: 11/02/2015 Document Reviewed: 02/01/2013 Elsevier Interactive Patient Education  2017 [employer]  

## 2016-04-24 ENCOUNTER — Encounter: Payer: Self-pay | Admitting: Family Medicine

## 2016-04-24 LAB — CBC WITH DIFFERENTIAL/PLATELET
BASOS: 1 %
Basophils Absolute: 0.1 10*3/uL (ref 0.0–0.2)
EOS (ABSOLUTE): 0.3 10*3/uL (ref 0.0–0.4)
EOS: 4 %
HEMATOCRIT: 50.8 % (ref 37.5–51.0)
HEMOGLOBIN: 16.9 g/dL (ref 12.6–17.7)
Immature Grans (Abs): 0 10*3/uL (ref 0.0–0.1)
Immature Granulocytes: 0 %
LYMPHS ABS: 2.3 10*3/uL (ref 0.7–3.1)
Lymphs: 25 %
MCH: 30.6 pg (ref 26.6–33.0)
MCHC: 33.3 g/dL (ref 31.5–35.7)
MCV: 92 fL (ref 79–97)
MONOCYTES: 10 %
MONOS ABS: 1 10*3/uL — AB (ref 0.1–0.9)
NEUTROS ABS: 5.4 10*3/uL (ref 1.4–7.0)
Neutrophils: 60 %
Platelets: 218 10*3/uL (ref 150–379)
RBC: 5.53 x10E6/uL (ref 4.14–5.80)
RDW: 13.7 % (ref 12.3–15.4)
WBC: 9.1 10*3/uL (ref 3.4–10.8)

## 2016-04-24 LAB — D-DIMER, QUANTITATIVE: D-DIMER: 1.2 mg/L FEU — ABNORMAL HIGH (ref 0.00–0.49)

## 2016-04-24 LAB — PROTIME-INR
INR: 1 (ref 0.8–1.2)
Prothrombin Time: 10.2 s (ref 9.1–12.0)

## 2016-04-25 ENCOUNTER — Telehealth: Payer: Self-pay

## 2016-04-25 ENCOUNTER — Telehealth: Payer: Self-pay | Admitting: Cardiology

## 2016-04-25 DIAGNOSIS — M1612 Unilateral primary osteoarthritis, left hip: Secondary | ICD-10-CM | POA: Diagnosis not present

## 2016-04-25 DIAGNOSIS — M25552 Pain in left hip: Secondary | ICD-10-CM | POA: Diagnosis not present

## 2016-04-25 NOTE — Telephone Encounter (Signed)
-----   Message from Margo Common, [place] sent at 04/25/2016  8:23 AM EST ----- Blood counts essentially normal. No sign of infection. D-dimer confirms some clots there. Should follow up here or with the oncologist to reassess progress with clearing of the thrombophlebitis in 10 days. Sooner if any problems.

## 2016-04-25 NOTE — Telephone Encounter (Signed)
Patient came here to the clinic because he reported a allergic reaction to the contrast that was used for his stress test. Brought him back and he showed me his arm where there was a bruise noted to the previous injection site. Provided him with reference material on the lexiscan isotope that was used and let him know that it was not considered an allergic reaction but just some irritation. He was appreciative for my time and had no further questions at this time.

## 2016-04-25 NOTE — Telephone Encounter (Signed)
[id number] 

## 2016-04-26 NOTE — Telephone Encounter (Signed)
Patient was advised he wanted to know if you would suggest he take Eliquis or Aspirin to help break up the clots? KW

## 2016-04-26 NOTE — Telephone Encounter (Signed)
Patient was advised. KW 

## 2016-04-26 NOTE — Telephone Encounter (Signed)
[id number] 

## 2016-04-26 NOTE — Telephone Encounter (Signed)
I would use the ASA 325 mg qd to prevent extra clots. Want this to slowly dissolve versus break up the clots and pieces float off to block any smaller vessels.

## 2016-05-06 ENCOUNTER — Encounter: Payer: Self-pay | Admitting: Family Medicine

## 2016-05-06 ENCOUNTER — Ambulatory Visit (INDEPENDENT_AMBULATORY_CARE_PROVIDER_SITE_OTHER): Payer: Medicare Other | Admitting: Family Medicine

## 2016-05-06 VITALS — BP 112/74 | HR 84 | Temp 97.9°F | Wt 198.6 lb

## 2016-05-06 DIAGNOSIS — T801XXD Vascular complications following infusion, transfusion and therapeutic injection, subsequent encounter: Secondary | ICD-10-CM | POA: Diagnosis not present

## 2016-05-06 DIAGNOSIS — Z23 Encounter for immunization: Secondary | ICD-10-CM

## 2016-05-06 DIAGNOSIS — M1612 Unilateral primary osteoarthritis, left hip: Secondary | ICD-10-CM | POA: Diagnosis not present

## 2016-05-06 DIAGNOSIS — I809 Phlebitis and thrombophlebitis of unspecified site: Secondary | ICD-10-CM

## 2016-05-06 DIAGNOSIS — M25552 Pain in left hip: Secondary | ICD-10-CM | POA: Diagnosis not present

## 2016-05-06 NOTE — Patient Instructions (Addendum)
Phlebitis Phlebitis is soreness and swelling (inflammation) of a vein. This can occur in your arms, legs, or torso (trunk), as well as deeper inside your body. Phlebitis is usually not serious when it occurs close to the surface of the body. However, it can cause serious problems when it occurs in a vein deeper inside the body. What are the causes? Phlebitis can be triggered by various things, including:  Reduced blood flow through your veins. This can happen with: ? Bed rest over a long period. ? Long-distance travel. ? Injury. ? Surgery. ? Being overweight (obese) or pregnant.  Having an IV tube put in the vein and getting certain medicines through the vein.  Cancer and cancer treatment.  Use of illegal drugs taken through the vein.  Inflammatory diseases.  Inherited (genetic) diseases that increase the risk of blood clots.  Hormone therapy, such as birth control pills.  What are the signs or symptoms?  Red, tender, swollen, and painful area on your skin. Usually, the area will be long and narrow.  Firmness along the center of the affected area. This can indicate that a blood clot has formed.  Low-grade fever. How is this diagnosed? A health care provider can usually diagnose phlebitis by examining the affected area and asking about your symptoms. To check for infection or blood clots, your health care provider may order blood tests or an ultrasound exam of the area. Blood tests and your family history may also indicate if you have an underlying genetic disease that causes blood clots. Occasionally, a piece of tissue is taken from the body (biopsy sample) if an unusual cause of phlebitis is suspected. How is this treated? Treatment will vary depending on the severity of the condition and the area of the body affected. Treatment may include:  Use of a warm compress or heating pad.  Use of compression stockings or bandages.  Anti-inflammatory medicines.  Removal of any IV  tube that may be causing the problem.  Medicines that kill germs (antibiotics) if an infection is present.  Blood-thinning medicines if a blood clot is suspected or present.  In rare cases, surgery may be needed to remove damaged sections of vein.  Follow these instructions at home:  Only take over-the-counter or prescription medicines as directed by your health care provider. Take all medicines exactly as prescribed.  Raise (elevate) the affected area above the level of your heart as directed by your health care provider.  Apply a warm compress or heating pad to the affected area as directed by your health care provider. Do not sleep with the heating pad.  Use compression stockings or bandages as directed. These will speed healing and prevent the condition from coming back.  If you are on blood thinners: ? Get follow-up blood tests as directed by your health care provider. ? Check with your health care provider before using any new medicines. ? Carry a medical alert card or wear your medical alert jewelry to show that you are on blood thinners.  For phlebitis in the legs: ? Avoid prolonged standing or bed rest. ? Keep your legs moving. Raise your legs when sitting or lying.  Do not smoke.  Women, particularly those over the age of 35, should consider the risks and benefits of taking the contraceptive pill. This kind of hormone treatment can increase your risk for blood clots.  Follow up with your health care provider as directed. Contact a health care provider if:  You have unusual bruising or any   bleeding problems.  Your swelling or pain in the affected area is not improving.  You are on anti-inflammatory medicine, and you develop belly (abdominal) pain. Get help right away if:  You have a sudden onset of chest pain or difficulty breathing.  You have a fever or persistent symptoms for more than 2-3 days.  You have a fever and your symptoms suddenly get worse. This  information is not intended to replace advice given to you by your health care provider. Make sure you discuss any questions you have with your health care provider. Document Released: 05/21/2001 Document Revised: 11/02/2015 Document Reviewed: 02/01/2013 Elsevier Interactive Patient Education  2017 [employer] Inc[employer]  

## 2016-05-06 NOTE — Progress Notes (Signed)
Patient: Jacob Patterson Male    DOB: 05-May-1951   65 y.o.   MRN: [id number] Visit Date: 05/06/2016  Today's Provider: Vernie Murders, PA   Chief Complaint  Patient presents with  . Follow-up   Subjective:    HPI  Phlebitis after infusion:  Patient is here for a 2 week follow up. D-dimer confirms some clots in patient's arm. Patient was advised to follow up here or with oncologist in 10 days. Patient was also advised to use the ASA 325 mg qd to prevent extra clots. Patient reports good compliance with treatment plan. He states arm is better.   Patient Active Problem List   Diagnosis Date Noted  . Hilar adenopathy 04/03/2016  . Mediastinal adenopathy 03/24/2016  . Nodule of right lung 03/24/2016  . Elevated prostate specific antigen (PSA) 03/27/2015  . Hx of pulmonary infarction 03/27/2015  . Arthritis 10/31/2014  . 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 D-dimer, elevated 10/31/2014  . Abnormal prostate specific antigen 10/31/2014  . Acid reflux 10/31/2014  . Hypercholesteremia 10/31/2014  . Hernia, inguinal 10/31/2014  . Arthralgia of hip 10/31/2014  . Awareness of heartbeats 10/31/2014  . Pleural cavity effusion 10/31/2014  . Phlebitis after infusion 10/31/2014  . Calculus of kidney 10/31/2014  . CD (contact dermatitis) 02/01/2008  . Excessive urination at night 05/14/2007  . Can't get food down 04/11/2006   Past Surgical History:  Procedure Laterality Date  . CATARACT EXTRACTION W/PHACO Left 11/23/2015   Procedure: CATARACT EXTRACTION PHACO AND INTRAOCULAR LENS PLACEMENT (IOC);  Surgeon: Eulogio Bear, MD;  Location: [facility] ORS;  Service: Ophthalmology;  Laterality: Left;  Lot # [id number] H [place]: 01:03.8 AP%: 19.5 CDE: 12.46   . CATARACT EXTRACTION W/PHACO Right 02/29/2016   Procedure: CATARACT EXTRACTION PHACO AND INTRAOCULAR LENS PLACEMENT (IOC);  Surgeon: Eulogio Bear, MD;  Location: [facility] ORS;  Service: Ophthalmology;  Laterality: Right;  [place] 26.4 AP% 14.9 CDE  3.93 Fluid Pack Lot # [id number]  . COLONOSCOPY  2008  . kidney stent    . TONSILLECTOMY    . TONSILLECTOMY AND ADENOIDECTOMY  1960   Family History  Problem Relation Age of Onset  . Arthritis Mother   . Stroke Mother   . Arthritis Father   . Dementia Father   . Cataracts Father   . Hyperlipidemia Sister   . Breast cancer Paternal Grandmother   . Heart attack Paternal Grandfather    Allergies  Allergen Reactions  . Aleve [Naproxen]    Previous Medications   ACETAMINOPHEN (TYLENOL) 500 MG TABLET    Take 1,000 mg by mouth.   APIXABAN (ELIQUIS) 5 MG TABS TABLET    Take 1 tablet (5 mg total) by mouth 2 (two) times daily.   ASPIRIN 81 MG TABLET    Take 81 mg by mouth daily.   B-COMPLEX TABS    Take 1 tablet by mouth every morning.   CHOLECALCIFEROL (VITAMIN D3) 2000 UNITS TABS    Take 1 tablet by mouth every morning.   FINASTERIDE (PROSCAR) 5 MG TABLET    Take 5 mg by mouth every morning.   MAGNESIUM PO    Take by mouth daily.   MELATONIN-PYRIDOXINE (MELATONEX PO)    Take 1 tablet by mouth at bedtime. 0.5   MULTIPLE VITAMINS-MINERALS (MULTIVITAMIN PO)    Take 1 tablet by mouth every morning.   NIACIN 500 MG TABLET    Take 500 mg by mouth at bedtime.   TAMSULOSIN (FLOMAX) 0.4 MG CAPS CAPSULE  Take 1 capsule (0.4 mg total) by mouth daily after supper.   TURMERIC PO    Take 1 capsule by mouth every morning.    Review of Systems  Constitutional: Negative.   HENT: Positive for ear pain.   Respiratory: Negative.   Cardiovascular: Negative.     Social History  Substance Use Topics  . Smoking status: Former Smoker    Types: Cigarettes  . Smokeless tobacco: Never Used     Comment: Quit in 1989  . Alcohol use 0.0 oz/week     Comment: occasionally drinks wine or beer   Objective:   BP 112/74 (BP Location: Right Arm, Patient Position: Sitting, Cuff Size: Normal)   Pulse 84   Temp 97.9 F (36.6 C) (Oral)   Wt 198 lb 9.6 oz (90.1 kg)   BMI 31.57 kg/m   Physical Exam    Constitutional: He is oriented to person, place, and time. He appears well-developed and well-nourished. No distress.  HENT:  Head: Normocephalic and atraumatic.  Right Ear: Hearing normal.  Left Ear: Hearing normal.  Nose: Nose normal.  Eyes: Conjunctivae and lids are normal. Right eye exhibits no discharge. Left eye exhibits no discharge. No scleral icterus.  Neck: Neck supple.  Cardiovascular: Normal rate.   Thickened right antecubital vein phlebitis. Only slight soreness remains. Good pulses bilaterally.  Pulmonary/Chest: Effort normal. No respiratory distress.  Musculoskeletal: Normal range of motion.  Neurological: He is alert and oriented to person, place, and time.  Skin: Skin is intact. No lesion and no rash noted.  Psychiatric: He has a normal mood and affect. His speech is normal and behavior is normal. Thought content normal.      Assessment & Plan:     1. Phlebitis after infusion, subsequent encounter Feeling improved. Less soreness in the right antecubital fossa vein that showed venous obstruction in the basilic vein on Venous Duplex study on 04-23-16. Still taking ASA 325 mg qd. No longer on the Eliquis 5 mg. D-Dimer was 1.20 - Dr. Odetta Pink ([facility]-oncologist) consulted a vascular specialist and the did not feel he need the Eliquis now. With improvement, will continue ASA and recheck in 6 weeks.

## 2016-05-14 DIAGNOSIS — Z79899 Other long term (current) drug therapy: Secondary | ICD-10-CM | POA: Diagnosis not present

## 2016-05-14 DIAGNOSIS — K2270 Barrett's esophagus without dysplasia: Secondary | ICD-10-CM | POA: Diagnosis not present

## 2016-05-14 DIAGNOSIS — K449 Diaphragmatic hernia without obstruction or gangrene: Secondary | ICD-10-CM | POA: Diagnosis not present

## 2016-05-14 DIAGNOSIS — K228 Other specified diseases of esophagus: Secondary | ICD-10-CM | POA: Diagnosis not present

## 2016-05-14 DIAGNOSIS — Z7982 Long term (current) use of aspirin: Secondary | ICD-10-CM | POA: Diagnosis not present

## 2016-05-14 DIAGNOSIS — K3189 Other diseases of stomach and duodenum: Secondary | ICD-10-CM | POA: Diagnosis not present

## 2016-05-14 DIAGNOSIS — R590 Localized enlarged lymph nodes: Secondary | ICD-10-CM | POA: Diagnosis not present

## 2016-05-20 DIAGNOSIS — M25552 Pain in left hip: Secondary | ICD-10-CM | POA: Diagnosis not present

## 2016-05-20 DIAGNOSIS — M1612 Unilateral primary osteoarthritis, left hip: Secondary | ICD-10-CM | POA: Diagnosis not present

## 2016-05-28 DIAGNOSIS — R918 Other nonspecific abnormal finding of lung field: Secondary | ICD-10-CM | POA: Diagnosis not present

## 2016-05-28 DIAGNOSIS — K219 Gastro-esophageal reflux disease without esophagitis: Secondary | ICD-10-CM | POA: Diagnosis not present

## 2016-05-28 DIAGNOSIS — Z6832 Body mass index (BMI) 32.0-32.9, adult: Secondary | ICD-10-CM | POA: Diagnosis not present

## 2016-05-28 DIAGNOSIS — R590 Localized enlarged lymph nodes: Secondary | ICD-10-CM | POA: Diagnosis not present

## 2016-06-17 ENCOUNTER — Ambulatory Visit (INDEPENDENT_AMBULATORY_CARE_PROVIDER_SITE_OTHER): Payer: Medicare Other | Admitting: Family Medicine

## 2016-06-17 ENCOUNTER — Encounter: Payer: Self-pay | Admitting: Family Medicine

## 2016-06-17 VITALS — BP 110/72 | HR 84 | Temp 97.5°F | Resp 16 | Wt 201.0 lb

## 2016-06-17 DIAGNOSIS — T801XXD Vascular complications following infusion, transfusion and therapeutic injection, subsequent encounter: Secondary | ICD-10-CM

## 2016-06-17 DIAGNOSIS — I809 Phlebitis and thrombophlebitis of unspecified site: Secondary | ICD-10-CM

## 2016-06-17 NOTE — Progress Notes (Signed)
Patient: Jacob Patterson Male    DOB: 09-19-1950   66 y.o.   MRN: [id number] Visit Date: 06/17/2016  Today's Provider: Vernie Murders, PA   Chief Complaint  Patient presents with  . Follow-up   Subjective:    HPI  Phlebitis after infusion: Patient is here for a 2 month follow up. Patient had less soreness in the right arm. Advised to continue taking aspirin 325 mg. Patient reports good compliance with recommendation. Patient denies any right arm soreness/pain but area is noticeable.   Past Medical History:  Diagnosis Date  . Arthritis   . Diverticulosis   . Dizziness    medication related  . Dizziness   . Environmental allergies   . GERD (gastroesophageal reflux disease)   . Hiatal hernia   . HOH (hard of hearing)   . Hypercholesteremia   . Kidney stones   . Murmur   . PE (pulmonary embolism)   . Wheezing    Past Surgical History:  Procedure Laterality Date  . CATARACT EXTRACTION W/PHACO Left 11/23/2015   Procedure: CATARACT EXTRACTION PHACO AND INTRAOCULAR LENS PLACEMENT (IOC);  Surgeon: Eulogio Bear, MD;  Location: [facility] ORS;  Service: Ophthalmology;  Laterality: Left;  Lot # [id number] H [place]: 01:03.8 AP%: 19.5 CDE: 12.46   . CATARACT EXTRACTION W/PHACO Right 02/29/2016   Procedure: CATARACT EXTRACTION PHACO AND INTRAOCULAR LENS PLACEMENT (IOC);  Surgeon: Eulogio Bear, MD;  Location: [facility] ORS;  Service: Ophthalmology;  Laterality: Right;  [place] 26.4 AP% 14.9 CDE 3.93 Fluid Pack Lot # [id number]  . COLONOSCOPY  2008  . kidney stent    . TONSILLECTOMY    . TONSILLECTOMY AND ADENOIDECTOMY  1960   Family History  Problem Relation Age of Onset  . Arthritis Mother   . Stroke Mother   . Arthritis Father   . Dementia Father   . Cataracts Father   . Hyperlipidemia Sister   . Breast cancer Paternal Grandmother   . Heart attack Paternal Grandfather    Allergies  Allergen Reactions  . Aleve [Naproxen]      Previous Medications   ACETAMINOPHEN (TYLENOL) 500 MG  TABLET    Take 1,000 mg by mouth.   APIXABAN (ELIQUIS) 5 MG TABS TABLET    Take 1 tablet (5 mg total) by mouth 2 (two) times daily.   ASPIRIN 81 MG TABLET    Take 81 mg by mouth daily.   B-COMPLEX TABS    Take 1 tablet by mouth every morning.   CHOLECALCIFEROL (VITAMIN D3) 2000 UNITS TABS    Take 1 tablet by mouth every morning.   FINASTERIDE (PROSCAR) 5 MG TABLET    Take 5 mg by mouth every morning.   MAGNESIUM PO    Take by mouth daily.   MELATONIN-PYRIDOXINE (MELATONEX PO)    Take 1 tablet by mouth at bedtime. 0.5   MULTIPLE VITAMINS-MINERALS (MULTIVITAMIN PO)    Take 1 tablet by mouth every morning.   NIACIN 500 MG TABLET    Take 500 mg by mouth at bedtime.   TAMSULOSIN (FLOMAX) 0.4 MG CAPS CAPSULE    Take 1 capsule (0.4 mg total) by mouth daily after supper.   TURMERIC PO    Take 1 capsule by mouth every morning.    Review of Systems  Constitutional: Negative.   Respiratory: Negative.   Cardiovascular: Negative.     Social History  Substance Use Topics  . Smoking status: Former Smoker    Types: Cigarettes  . Smokeless tobacco: Never  Used     Comment: Quit in 1989  . Alcohol use 0.0 oz/week     Comment: occasionally drinks wine or beer   Objective:   BP 110/72 (BP Location: Right Arm, Patient Position: Sitting, Cuff Size: Normal)   Pulse 84   Temp 97.5 F (36.4 C) (Oral)   Resp 16   Wt 201 lb (91.2 kg)   SpO2 98%   BMI 31.96 kg/m   Physical Exam  Constitutional: He appears well-developed and well-nourished.  HENT:  Head: Normocephalic.  Mouth/Throat: Oropharynx is clear and moist.  Eyes: Conjunctivae are normal.  Neck: Neck supple.  Cardiovascular: Normal rate and regular rhythm.   Pulmonary/Chest: Breath sounds normal.  Abdominal: Soft. Bowel sounds are normal.  Musculoskeletal:  Right arm phlebitis improved. Still has a knotty/ropey vein from the antecubital fossa to the axilla. Good pulses bilaterally.  Lymphadenopathy:    He has no cervical adenopathy.       Assessment & Plan:     1. Phlebitis after infusion, subsequent encounter No further soreness or dyspnea from phlebitis in the right upper inner arm. No redness and good pulses. Suspect it will continue to resolve over the next few months. Continue ASA one qd and recheck prn.

## 2016-08-13 DIAGNOSIS — R918 Other nonspecific abnormal finding of lung field: Secondary | ICD-10-CM | POA: Diagnosis not present

## 2016-08-13 DIAGNOSIS — K449 Diaphragmatic hernia without obstruction or gangrene: Secondary | ICD-10-CM | POA: Diagnosis not present

## 2016-08-13 DIAGNOSIS — Z87891 Personal history of nicotine dependence: Secondary | ICD-10-CM | POA: Diagnosis not present

## 2016-08-13 DIAGNOSIS — R590 Localized enlarged lymph nodes: Secondary | ICD-10-CM | POA: Diagnosis not present

## 2016-08-13 DIAGNOSIS — K2270 Barrett's esophagus without dysplasia: Secondary | ICD-10-CM | POA: Diagnosis not present

## 2016-08-13 DIAGNOSIS — K219 Gastro-esophageal reflux disease without esophagitis: Secondary | ICD-10-CM | POA: Diagnosis not present

## 2016-08-13 DIAGNOSIS — M79621 Pain in right upper arm: Secondary | ICD-10-CM | POA: Diagnosis not present

## 2016-08-13 DIAGNOSIS — Z6832 Body mass index (BMI) 32.0-32.9, adult: Secondary | ICD-10-CM | POA: Diagnosis not present

## 2016-08-24 ENCOUNTER — Encounter: Payer: Self-pay | Admitting: Family Medicine

## 2016-09-09 ENCOUNTER — Telehealth: Payer: Self-pay | Admitting: Family Medicine

## 2016-10-04 DIAGNOSIS — H40003 Preglaucoma, unspecified, bilateral: Secondary | ICD-10-CM | POA: Diagnosis not present

## 2016-10-08 ENCOUNTER — Ambulatory Visit (INDEPENDENT_AMBULATORY_CARE_PROVIDER_SITE_OTHER): Payer: Medicare Other

## 2016-10-08 ENCOUNTER — Encounter: Payer: Self-pay | Admitting: Family Medicine

## 2016-10-08 ENCOUNTER — Ambulatory Visit (INDEPENDENT_AMBULATORY_CARE_PROVIDER_SITE_OTHER): Payer: Medicare Other | Admitting: Family Medicine

## 2016-10-08 VITALS — BP 110/76 | HR 104 | Temp 98.2°F | Ht 67.0 in | Wt 200.8 lb

## 2016-10-08 DIAGNOSIS — M25552 Pain in left hip: Secondary | ICD-10-CM

## 2016-10-08 DIAGNOSIS — E7800 Pure hypercholesterolemia, unspecified: Secondary | ICD-10-CM

## 2016-10-08 DIAGNOSIS — N400 Enlarged prostate without lower urinary tract symptoms: Secondary | ICD-10-CM

## 2016-10-08 DIAGNOSIS — Z1159 Encounter for screening for other viral diseases: Secondary | ICD-10-CM

## 2016-10-08 DIAGNOSIS — Z8709 Personal history of other diseases of the respiratory system: Secondary | ICD-10-CM

## 2016-10-08 DIAGNOSIS — Z0000 Encounter for general adult medical examination without abnormal findings: Secondary | ICD-10-CM

## 2016-10-08 NOTE — Patient Instructions (Signed)
Mr. Jacob Patterson , Thank you for taking time to come for your Medicare Wellness Visit. I appreciate your ongoing commitment to your health goals. Please review the following plan we discussed and let me know if I can assist you in the future.   Screening recommendations/referrals: Colonoscopy: completed 04/21/07, due 04/2017 Recommended yearly ophthalmology/optometry visit for glaucoma screening and checkup Recommended yearly dental visit for hygiene and checkup  Vaccinations: Influenza vaccine: up to date, due 02/2017 Pneumococcal vaccine: completed series Tdap vaccine: completed 09/06/09, due 08/2019 Shingles vaccine: completed per patient   Advanced directives: Advance directive discussed with you today. I have provided a copy for you to complete at home and have notarized. Once this is complete please bring a copy in to our office so we can scan it into your chart.  Conditions/risks identified: Fall risk prevention and recommend increasing water intake to 6 glasses daily.   Next appointment: None, need to schedule 1 year AWV.  Preventive Care 72 Years and Older, Male Preventive care refers to lifestyle choices and visits with your health care provider that can promote health and wellness. What does preventive care include?  A yearly physical exam. This is also called an annual well check.  Dental exams once or twice a year.  Routine eye exams. Ask your health care provider how often you should have your eyes checked.  Personal lifestyle choices, including:  Daily care of your teeth and gums.  Regular physical activity.  Eating a healthy diet.  Avoiding tobacco and drug use.  Limiting alcohol use.  Practicing safe sex.  Taking low doses of aspirin every day.  Taking vitamin and mineral supplements as recommended by your health care provider. What happens during an annual well check? The services and screenings done by your health care provider during your annual well  check will depend on your age, overall health, lifestyle risk factors, and family history of disease. Counseling  Your health care provider may ask you questions about your:  Alcohol use.  Tobacco use.  Drug use.  Emotional well-being.  Home and relationship well-being.  Sexual activity.  Eating habits.  History of falls.  Memory and ability to understand (cognition).  Work and work Statistician. Screening  You may have the following tests or measurements:  Height, weight, and BMI.  Blood pressure.  Lipid and cholesterol levels. These may be checked every 5 years, or more frequently if you are over 62 years old.  Skin check.  Lung cancer screening. You may have this screening every year starting at age 30 if you have a 30-pack-year history of smoking and currently smoke or have quit within the past 15 years.  Fecal occult blood test (FOBT) of the stool. You may have this test every year starting at age 34.  Flexible sigmoidoscopy or colonoscopy. You may have a sigmoidoscopy every 5 years or a colonoscopy every 10 years starting at age 78.  Prostate cancer screening. Recommendations will vary depending on your family history and other risks.  Hepatitis C blood test.  Hepatitis B blood test.  Sexually transmitted disease (STD) testing.  Diabetes screening. This is done by checking your blood sugar (glucose) after you have not eaten for a while (fasting). You may have this done every 1-3 years.  Abdominal aortic aneurysm (AAA) screening. You may need this if you are a current or former smoker.  Osteoporosis. You may be screened starting at age 22 if you are at high risk. Talk with your health care provider about  your test results, treatment options, and if necessary, the need for more tests. Vaccines  Your health care provider may recommend certain vaccines, such as:  Influenza vaccine. This is recommended every year.  Tetanus, diphtheria, and acellular  pertussis (Tdap, Td) vaccine. You may need a Td booster every 10 years.  Zoster vaccine. You may need this after age 66.  Pneumococcal 13-valent conjugate (PCV13) vaccine. One dose is recommended after age 50.  Pneumococcal polysaccharide (PPSV23) vaccine. One dose is recommended after age 19. Talk to your health care provider about which screenings and vaccines you need and how often you need them. This information is not intended to replace advice given to you by your health care provider. Make sure you discuss any questions you have with your health care provider. Document Released: 06/23/2015 Document Revised: 02/14/2016 Document Reviewed: 03/28/2015 Elsevier Interactive Patient Education  2017 [place] Prevention in the Home Falls can cause injuries. They can happen to people of all ages. There are many things you can do to make your home safe and to help prevent falls. What can I do on the outside of my home?  Regularly fix the edges of walkways and driveways and fix any cracks.  Remove anything that might make you trip as you walk through a door, such as a raised step or threshold.  Trim any bushes or trees on the path to your home.  Use bright outdoor lighting.  Clear any walking paths of anything that might make someone trip, such as rocks or tools.  Regularly check to see if handrails are loose or broken. Make sure that both sides of any steps have handrails.  Any raised decks and porches should have guardrails on the edges.  Have any leaves, snow, or ice cleared regularly.  Use sand or salt on walking paths during winter.  Clean up any spills in your garage right away. This includes oil or grease spills. What can I do in the bathroom?  Use night lights.  Install grab bars by the toilet and in the tub and shower. Do not use towel bars as grab bars.  Use non-skid mats or decals in the tub or shower.  If you need to sit down in the shower, use a plastic,  non-slip stool.  Keep the floor dry. Clean up any water that spills on the floor as soon as it happens.  Remove soap buildup in the tub or shower regularly.  Attach bath mats securely with double-sided non-slip rug tape.  Do not have throw rugs and other things on the floor that can make you trip. What can I do in the bedroom?  Use night lights.  Make sure that you have a light by your bed that is easy to reach.  Do not use any sheets or blankets that are too big for your bed. They should not hang down onto the floor.  Have a firm chair that has side arms. You can use this for support while you get dressed.  Do not have throw rugs and other things on the floor that can make you trip. What can I do in the kitchen?  Clean up any spills right away.  Avoid walking on wet floors.  Keep items that you use a lot in easy-to-reach places.  If you need to reach something above you, use a strong step stool that has a grab bar.  Keep electrical cords out of the way.  Do not use floor polish or wax  that makes floors slippery. If you must use wax, use non-skid floor wax.  Do not have throw rugs and other things on the floor that can make you trip. What can I do with my stairs?  Do not leave any items on the stairs.  Make sure that there are handrails on both sides of the stairs and use them. Fix handrails that are broken or loose. Make sure that handrails are as long as the stairways.  Check any carpeting to make sure that it is firmly attached to the stairs. Fix any carpet that is loose or worn.  Avoid having throw rugs at the top or bottom of the stairs. If you do have throw rugs, attach them to the floor with carpet tape.  Make sure that you have a light switch at the top of the stairs and the bottom of the stairs. If you do not have them, ask someone to add them for you. What else can I do to help prevent falls?  Wear shoes that:  Do not have high heels.  Have rubber  bottoms.  Are comfortable and fit you well.  Are closed at the toe. Do not wear sandals.  If you use a stepladder:  Make sure that it is fully opened. Do not climb a closed stepladder.  Make sure that both sides of the stepladder are locked into place.  Ask someone to hold it for you, if possible.  Clearly mark and make sure that you can see:  Any grab bars or handrails.  First and last steps.  Where the edge of each step is.  Use tools that help you move around (mobility aids) if they are needed. These include:  Canes.  Walkers.  Scooters.  Crutches.  Turn on the lights when you go into a dark area. Replace any light bulbs as soon as they burn out.  Set up your furniture so you have a clear path. Avoid moving your furniture around.  If any of your floors are uneven, fix them.  If there are any pets around you, be aware of where they are.  Review your medicines with your doctor. Some medicines can make you feel dizzy. This can increase your chance of falling. Ask your doctor what other things that you can do to help prevent falls. This information is not intended to replace advice given to you by your health care provider. Make sure you discuss any questions you have with your health care provider. Document Released: 03/23/2009 Document Revised: 11/02/2015 Document Reviewed: 07/01/2014 Elsevier Interactive Patient Education  2017 [employer].

## 2016-10-08 NOTE — Progress Notes (Signed)
Patient: Jacob Patterson, Male    DOB: 04/09/1951, 66 y.o.   MRN: [id number] Visit Date: 10/08/2016  Today's Provider: Vernie Murders, PA   Chief Complaint  Patient presents with  . Annual Exam   Subjective:    Annual physical exam Jacob Patterson is a 66 y.o. male who presents today for health maintenance and complete physical. He feels fairly well. He reports exercising none secondary to hip pain. He reports he is sleeping fairly well. Patient reports normally awakening around 4 am and it takes him a while to get back to sleep.   ----------------------------------------------------------------- Colonoscopy- 04/21/2007 Tetanus- 09/06/2009   Review of Systems  Constitutional: Positive for activity change.  HENT: Positive for congestion, hearing loss, postnasal drip and rhinorrhea.   Eyes: Positive for discharge.  Respiratory: Positive for cough.   Cardiovascular: Negative.   Gastrointestinal: Negative.   Endocrine: Negative.   Genitourinary: Negative.   Musculoskeletal: Positive for arthralgias.  Skin: Negative.   Allergic/Immunologic: Positive for environmental allergies.  Neurological: Negative.   Hematological: Negative.   Psychiatric/Behavioral: Negative.     Social History      He  reports that he has quit smoking. His smoking use included Cigarettes. He has never used smokeless tobacco. He reports that he drinks about 3.6 - 4.2 oz of alcohol per week . He reports that he does not use drugs.       Social History   Social History  . Marital status: Single    Spouse name: N/A  . Number of children: N/A  . Years of education: N/A   Social History Main Topics  . Smoking status: Former Smoker    Types: Cigarettes  . Smokeless tobacco: Never Used     Comment: Quit in 1989  . Alcohol use 3.6 - 4.2 oz/week    6 - 7 Glasses of wine per week  . Drug use: No  . Sexual activity: Not Asked   Other Topics Concern  . None   Social History Narrative  . None     Past Medical History:  Diagnosis Date  . Arthritis   . Diverticulosis   . Dizziness    medication related  . Dizziness   . Environmental allergies   . GERD (gastroesophageal reflux disease)   . Hiatal hernia   . HOH (hard of hearing)   . Hypercholesteremia   . Kidney stones   . Murmur   . PE (pulmonary embolism)   . Wheezing      Patient Active Problem List   Diagnosis Date Noted  . Hilar adenopathy 04/03/2016  . Mediastinal adenopathy 03/24/2016  . Nodule of right lung 03/24/2016  . Elevated prostate specific antigen (PSA) 03/27/2015  . Hx of pulmonary infarction 03/27/2015  . Arthritis 10/31/2014  . 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 D-dimer, elevated 10/31/2014  . Abnormal prostate specific antigen 10/31/2014  . Acid reflux 10/31/2014  . Hypercholesteremia 10/31/2014  . Hernia, inguinal 10/31/2014  . Arthralgia of hip 10/31/2014  . Awareness of heartbeats 10/31/2014  . Pleural cavity effusion 10/31/2014  . Phlebitis after infusion 10/31/2014  . Calculus of kidney 10/31/2014  . CD (contact dermatitis) 02/01/2008  . Excessive urination at night 05/14/2007  . Can't get food down 04/11/2006    Past Surgical History:  Procedure Laterality Date  . CATARACT EXTRACTION W/PHACO Left 11/23/2015   Procedure: CATARACT EXTRACTION PHACO AND INTRAOCULAR LENS PLACEMENT (IOC);  Surgeon: Eulogio Bear, MD;  Location: [facility] ORS;  Service: Ophthalmology;  Laterality: Left;  Lot # [id number] H [place]: 01:03.8 AP%: 19.5 CDE: 12.46   . CATARACT EXTRACTION W/PHACO Right 02/29/2016   Procedure: CATARACT EXTRACTION PHACO AND INTRAOCULAR LENS PLACEMENT (IOC);  Surgeon: Eulogio Bear, MD;  Location: [facility] ORS;  Service: Ophthalmology;  Laterality: Right;  [place] 26.4 AP% 14.9 CDE 3.93 Fluid Pack Lot # [id number]  . COLONOSCOPY  2008  . kidney stent    . TONSILLECTOMY    . TONSILLECTOMY AND ADENOIDECTOMY  1960    Family History        Family Status  Relation Status  . Mother  Deceased  . Father Deceased  . Sister Alive  . Brother Alive  . Paternal Grandmother Deceased  . Paternal Grandfather Deceased  . Sister Alive  . Brother Alive        His family history includes Arthritis in his father and mother; Breast cancer in his paternal grandmother; Cataracts in his father; Dementia in his father; Heart attack in his paternal grandfather; Hyperlipidemia in his sister; Stroke in his mother.     Allergies  Allergen Reactions  . Aleve [Naproxen]     Current Outpatient Prescriptions:  .  acetaminophen (TYLENOL) 500 MG tablet, Take 1,000 mg by mouth., Disp: , Rfl:  .  acetaminophen (TYLENOL) 650 MG CR tablet, Take 650 mg by mouth every 8 (eight) hours as needed for pain., Disp: , Rfl:  .  aspirin 81 MG tablet, Take 81 mg by mouth daily., Disp: , Rfl:  .  B-Complex TABS, Take 1 tablet by mouth every morning., Disp: , Rfl:  .  Cholecalciferol (VITAMIN D3) 2000 UNITS TABS, Take 1 tablet by mouth every morning., Disp: , Rfl:  .  finasteride (PROSCAR) 5 MG tablet, Take 5 mg by mouth every morning., Disp: , Rfl:  .  MAGNESIUM PO, Take by mouth daily., Disp: , Rfl:  .  Melatonin-Pyridoxine (MELATONEX PO), Take 1 tablet by mouth at bedtime. 0.5, Disp: , Rfl:  .  Multiple Vitamins-Minerals (MULTIVITAMIN PO), Take 1 tablet by mouth every morning., Disp: , Rfl:  .  niacin 500 MG tablet, Take 500 mg by mouth at bedtime., Disp: , Rfl:  .  tamsulosin (FLOMAX) 0.4 MG CAPS capsule, Take 1 capsule (0.4 mg total) by mouth daily after supper., Disp: 30 capsule, Rfl: 3 .  TURMERIC PO, Take 1 capsule by mouth every morning., Disp: , Rfl:  .  apixaban (ELIQUIS) 5 MG TABS tablet, Take 1 tablet (5 mg total) by mouth 2 (two) times daily. (Patient not taking: Reported on 06/17/2016), Disp: 60 tablet, Rfl: 3   Patient Care Team: Margo Common, PA as PCP - General (Physician Assistant) Eulogio Bear, MD as Consulting Physician (Ophthalmology) Tana Felts, MD as Referring  Physician Forde Dandy, PA as Consulting Physician (Physician Assistant)      Objective:   Vitals: BP 110/76 (Patient Position: Sitting, Cuff Size: Normal)   Pulse (!) 104   Temp 98.2 F (36.8 C) (Oral)   Ht 5\' 7"  (1.702 m)   Wt 200 lb 12.8 oz (91.1 kg)   BMI 31.45 kg/m    Vitals:   10/08/16 1348  BP: 110/76  Pulse: (!) 104  Temp: 98.2 F (36.8 C)  TempSrc: Oral  Weight: 200 lb 12.8 oz (91.1 kg)  Height: 5\' 7"  (1.702 m)     Physical Exam   Depression Screen PHQ 2/9 Scores 10/08/2016 10/08/2016  PHQ - 2 Score 0 0  PHQ- 9 Score 4 -  Assessment & Plan:     Routine Health Maintenance and Physical Exam  Exercise Activities and Dietary recommendations Goals    . Increase water intake          Recommend increasing water intake to 6 glasses a day.       Immunization History  Administered Date(s) Administered  . Influenza, High Dose Seasonal PF 06/28/2014  . Influenza,inj,Quad PF,36+ Mos 04/27/2013, 03/28/2016  . Influenza-Unspecified 06/08/2015  . Pneumococcal Conjugate-13 06/28/2014  . Pneumococcal Polysaccharide-23 05/06/2016  . Td 09/06/2009    Health Maintenance  Topic Date Due  . Hepatitis C Screening  1950-10-06  . INFLUENZA VACCINE  01/08/2017  . COLONOSCOPY  04/20/2017  . TETANUS/TDAP  09/07/2019  . PNA vac Low Risk Adult  Completed     Discussed health benefits of physical activity, and encouraged him to engage in regular exercise appropriate for his age and condition.    --------------------------------------------------------------------  1. Pain of left hip joint Progression of joint space narrowing and pain to rotate the left hip medially demonstrated on exam to day and in x-rays of 2017. Still using Tylenol Arthritis strength BID. Recheck routine labs.  - CBC with Differential/Platelet  2. Hypercholesteremia Trying to follow a low fat diet and exercise as much as arthritis in left hip will allow. Continues Niacin. Recheck  CMP, lipids and TSH. Follow up pending reports. - Comprehensive metabolic panel - Lipid panel - TSH  3. Hx of pulmonary infarction Well healed without chest pains. No dyspnea. Diagnosed in 2016.  4. Benign fibroma of prostate Continues to take the Finasteride and Flomax for BPH. Will follow up with urologist and get PSA 10-22-16 at [facility] (Dr. Carlis Abbott). - CBC with Differential/Platelet   Vernie Murders, PA  [facility]

## 2016-10-08 NOTE — Progress Notes (Signed)
Subjective:   Jacob Patterson is a 66 y.o. male who presents for Medicare Annual/Subsequent preventive examination.  Review of Systems:  N/A  Cardiac Risk Factors include: advanced age (>21men, >65 women);male gender;obesity (BMI >30kg/m2)     Objective:    Vitals: BP 110/76 (BP Location: Right Arm)   Pulse (!) 104   Temp 98.2 F (36.8 C) (Oral)   Ht 5\' 7"  (1.702 m)   Wt 200 lb 12.8 oz (91.1 kg)   BMI 31.45 kg/m   Body mass index is 31.45 kg/m.  Tobacco History  Smoking Status  . Former Smoker  . Types: Cigarettes  Smokeless Tobacco  . Never Used    Comment: Quit in 1989     Counseling given: Not Answered   Past Medical History:  Diagnosis Date  . Arthritis   . Diverticulosis   . Dizziness    medication related  . Dizziness   . Environmental allergies   . GERD (gastroesophageal reflux disease)   . Hiatal hernia   . HOH (hard of hearing)   . Hypercholesteremia   . Kidney stones   . Murmur   . PE (pulmonary embolism)   . Wheezing    Past Surgical History:  Procedure Laterality Date  . CATARACT EXTRACTION W/PHACO Left 11/23/2015   Procedure: CATARACT EXTRACTION PHACO AND INTRAOCULAR LENS PLACEMENT (IOC);  Surgeon: Eulogio Bear, MD;  Location: [facility] ORS;  Service: Ophthalmology;  Laterality: Left;  Lot # [id number] H [place]: 01:03.8 AP%: 19.5 CDE: 12.46   . CATARACT EXTRACTION W/PHACO Right 02/29/2016   Procedure: CATARACT EXTRACTION PHACO AND INTRAOCULAR LENS PLACEMENT (IOC);  Surgeon: Eulogio Bear, MD;  Location: [facility] ORS;  Service: Ophthalmology;  Laterality: Right;  [place] 26.4 AP% 14.9 CDE 3.93 Fluid Pack Lot # [id number]  . COLONOSCOPY  2008  . kidney stent    . TONSILLECTOMY    . TONSILLECTOMY AND ADENOIDECTOMY  1960   Family History  Problem Relation Age of Onset  . Arthritis Mother   . Stroke Mother   . Arthritis Father   . Dementia Father   . Cataracts Father   . Hyperlipidemia Sister   . Breast cancer Paternal Grandmother   . Heart attack  Paternal Grandfather    History  Sexual Activity  . Sexual activity: Not on file    Outpatient Encounter Prescriptions as of 10/08/2016  Medication Sig  . acetaminophen (TYLENOL) 650 MG CR tablet Take 650 mg by mouth every 8 (eight) hours as needed for pain.  Marland Kitchen aspirin 81 MG tablet Take 81 mg by mouth daily.  Marland Kitchen B-Complex TABS Take 1 tablet by mouth every morning.  . Cholecalciferol (VITAMIN D3) 2000 UNITS TABS Take 1 tablet by mouth every morning.  . finasteride (PROSCAR) 5 MG tablet Take 5 mg by mouth every morning.  Marland Kitchen MAGNESIUM PO Take by mouth daily.  . Melatonin-Pyridoxine (MELATONEX PO) Take 1 tablet by mouth at bedtime. 0.5  . Multiple Vitamins-Minerals (MULTIVITAMIN PO) Take 1 tablet by mouth every morning.  . niacin 500 MG tablet Take 500 mg by mouth at bedtime.  . tamsulosin (FLOMAX) 0.4 MG CAPS capsule Take 1 capsule (0.4 mg total) by mouth daily after supper.  . TURMERIC PO Take 1 capsule by mouth every morning.  Marland Kitchen acetaminophen (TYLENOL) 500 MG tablet Take 1,000 mg by mouth.  Marland Kitchen apixaban (ELIQUIS) 5 MG TABS tablet Take 1 tablet (5 mg total) by mouth 2 (two) times daily. (Patient not taking: Reported on 06/17/2016)   No  facility-administered encounter medications on file as of 10/08/2016.     Activities of Daily Living In your present state of health, do you have any difficulty performing the following activities: 10/08/2016  Hearing? Y  Vision? N  Difficulty concentrating or making decisions? Y  Walking or climbing stairs? Y  Dressing or bathing? N  Doing errands, shopping? N  Preparing Food and eating ? N  Using the Toilet? N  In the past six months, have you accidently leaked urine? N  Do you have problems with loss of bowel control? N  Managing your Medications? N  Managing your Finances? N  Housekeeping or managing your Housekeeping? N  Some recent data might be hidden    Patient Care Team: Margo Common, PA as PCP - General (Physician Assistant) Eulogio Bear, MD as Consulting Physician (Ophthalmology) Tana Felts, MD as Referring Physician Forde Dandy, PA as Consulting Physician (Physician Assistant)   Assessment:     Exercise Activities and Dietary recommendations Current Exercise Habits: The patient does not participate in regular exercise at present, Exercise limited by: None identified  Goals    . Increase water intake          Recommend increasing water intake to 6 glasses a day.      Fall Risk Fall Risk  10/08/2016  Falls in the past year? Yes  Number falls in past yr: 1  Injury with Fall? No   Depression Screen PHQ 2/9 Scores 10/08/2016 10/08/2016  PHQ - 2 Score 0 0  PHQ- 9 Score 4 -    Cognitive Function     6CIT Screen 10/08/2016  What Year? 0 points  What month? 0 points  What time? 0 points  Count back from 20 0 points  Months in reverse 2 points  Repeat phrase 0 points  Total Score 2    Immunization History  Administered Date(s) Administered  . Influenza, High Dose Seasonal PF 06/28/2014  . Influenza,inj,Quad PF,36+ Mos 04/27/2013, 03/28/2016  . Influenza-Unspecified 06/08/2015  . Pneumococcal Conjugate-13 06/28/2014  . Pneumococcal Polysaccharide-23 05/06/2016  . Td 09/06/2009   Screening Tests Health Maintenance  Topic Date Due  . Hepatitis C Screening  [birth year]  . INFLUENZA VACCINE  01/08/2017  . COLONOSCOPY  04/20/2017  . TETANUS/TDAP  09/07/2019  . PNA vac Low Risk Adult  Completed      Plan:  I have personally reviewed and addressed the Medicare Annual Wellness questionnaire and have noted the following in the patient's chart:  A. Medical and social history B. Use of alcohol, tobacco or illicit drugs  C. Current medications and supplements D. Functional ability and status E.  Nutritional status F.  Physical activity G. Advance directives H. List of other physicians I.  Hospitalizations, surgeries, and ER visits in previous 12 months J.  [place] such as  hearing and vision if needed, cognitive and depression L. Referrals and appointments - none  In addition, I have reviewed and discussed with patient certain preventive protocols, quality metrics, and best practice recommendations. A written personalized care plan for preventive services as well as general preventive health recommendations were provided to patient.  See attached scanned questionnaire for additional information.   Signed,  Fabio Neighbors, LPN Nurse Health Advisor   MD Recommendations: None. Reviewed Nurse Health Advisor's note and recommendations. Agree with note and plans. Was available for consultation during screening.

## 2016-10-10 ENCOUNTER — Other Ambulatory Visit: Payer: Self-pay | Admitting: Family Medicine

## 2016-10-10 DIAGNOSIS — N400 Enlarged prostate without lower urinary tract symptoms: Secondary | ICD-10-CM | POA: Diagnosis not present

## 2016-10-10 DIAGNOSIS — Z1159 Encounter for screening for other viral diseases: Secondary | ICD-10-CM | POA: Diagnosis not present

## 2016-10-10 DIAGNOSIS — E7800 Pure hypercholesterolemia, unspecified: Secondary | ICD-10-CM | POA: Diagnosis not present

## 2016-10-10 DIAGNOSIS — M25552 Pain in left hip: Secondary | ICD-10-CM | POA: Diagnosis not present

## 2016-10-11 LAB — COMPREHENSIVE METABOLIC PANEL
ALBUMIN: 4.2 g/dL (ref 3.6–4.8)
ALT: 20 IU/L (ref 0–44)
AST: 21 IU/L (ref 0–40)
Albumin/Globulin Ratio: 2.1 (ref 1.2–2.2)
Alkaline Phosphatase: 78 IU/L (ref 39–117)
BUN / CREAT RATIO: 12 (ref 10–24)
BUN: 16 mg/dL (ref 8–27)
Bilirubin Total: 0.3 mg/dL (ref 0.0–1.2)
CALCIUM: 9.6 mg/dL (ref 8.6–10.2)
CO2: 25 mmol/L (ref 18–29)
CREATININE: 1.37 mg/dL — AB (ref 0.76–1.27)
Chloride: 103 mmol/L (ref 96–106)
GFR, EST AFRICAN AMERICAN: 61 mL/min/{1.73_m2} (ref >59)
GFR, EST NON AFRICAN AMERICAN: 53 mL/min/{1.73_m2} — AB (ref >59)
GLOBULIN, TOTAL: 2 g/dL (ref 1.5–4.5)
Glucose: 120 mg/dL — ABNORMAL HIGH (ref 65–99)
Potassium: 4.8 mmol/L (ref 3.5–5.2)
SODIUM: 143 mmol/L (ref 134–144)
TOTAL PROTEIN: 6.2 g/dL (ref 6.0–8.5)

## 2016-10-11 LAB — HEPATITIS C ANTIBODY

## 2016-10-11 LAB — CBC WITH DIFFERENTIAL/PLATELET
Basophils Absolute: 0 10*3/uL (ref 0.0–0.2)
Basos: 0 %
EOS (ABSOLUTE): 0.4 10*3/uL (ref 0.0–0.4)
EOS: 6 %
HEMATOCRIT: 50 % (ref 37.5–51.0)
HEMOGLOBIN: 17.4 g/dL (ref 13.0–17.7)
IMMATURE GRANS (ABS): 0 10*3/uL (ref 0.0–0.1)
IMMATURE GRANULOCYTES: 0 %
LYMPHS ABS: 1.7 10*3/uL (ref 0.7–3.1)
LYMPHS: 25 %
MCH: 31.4 pg (ref 26.6–33.0)
MCHC: 34.8 g/dL (ref 31.5–35.7)
MCV: 90 fL (ref 79–97)
MONOCYTES: 19 %
Monocytes Absolute: 1.3 10*3/uL — ABNORMAL HIGH (ref 0.1–0.9)
Neutrophils Absolute: 3.4 10*3/uL (ref 1.4–7.0)
Neutrophils: 50 %
Platelets: 207 10*3/uL (ref 150–379)
RBC: 5.55 x10E6/uL (ref 4.14–5.80)
RDW: 13.3 % (ref 12.3–15.4)
WBC: 6.7 10*3/uL (ref 3.4–10.8)

## 2016-10-11 LAB — LIPID PANEL
CHOL/HDL RATIO: 4 ratio (ref 0.0–5.0)
Cholesterol, Total: 195 mg/dL (ref 100–199)
HDL: 49 mg/dL (ref >39)
LDL CALC: 131 mg/dL — AB (ref 0–99)
Triglycerides: 73 mg/dL (ref 0–149)
VLDL Cholesterol Cal: 15 mg/dL (ref 5–40)

## 2016-10-11 LAB — TSH: TSH: 4.41 u[IU]/mL (ref 0.450–4.500)

## 2016-10-17 ENCOUNTER — Telehealth: Payer: Self-pay

## 2016-10-17 NOTE — Telephone Encounter (Signed)
-----   Message from [place], [place] sent at 10/15/2016  5:30 PM EDT ----- Slightly elevated glucose and kidney function unchanged from 6 months ago. Normal thyroid test and Hepatitis C test. LDL cholesterol elevated. Recommend low fat diet, regular exercise 30 minutes 4 days a week. May use Krill Oil qd and recheck levels in 3 months.

## 2016-10-17 NOTE — Telephone Encounter (Signed)
Patient advised.

## 2016-10-23 DIAGNOSIS — R972 Elevated prostate specific antigen [PSA]: Secondary | ICD-10-CM | POA: Diagnosis not present

## 2016-12-24 ENCOUNTER — Telehealth: Payer: Self-pay | Admitting: Family Medicine

## 2016-12-24 DIAGNOSIS — K5730 Diverticulosis of large intestine without perforation or abscess without bleeding: Secondary | ICD-10-CM

## 2016-12-24 DIAGNOSIS — Z860100 Personal history of colon polyps, unspecified: Secondary | ICD-10-CM

## 2016-12-24 DIAGNOSIS — Z9889 Other specified postprocedural states: Secondary | ICD-10-CM

## 2016-12-24 NOTE — Telephone Encounter (Signed)
Schedule colonoscopy for 10+ year follow up of sigmoid diverticulosis with 2 polypectomies. Last colonoscopy was on 05-22-06 by Dr. Lucilla Lame with EGD for esophageal stricture with dilation.

## 2016-12-24 NOTE — Telephone Encounter (Signed)
Pt is requesting a referral to have a colonlscopy with [facility].  CB#[phone]/MW

## 2016-12-25 NOTE — Telephone Encounter (Signed)
Referral in EPIC. Patient prefers appointment with [facility].

## 2017-01-30 DIAGNOSIS — K3189 Other diseases of stomach and duodenum: Secondary | ICD-10-CM | POA: Diagnosis not present

## 2017-01-30 DIAGNOSIS — K2270 Barrett's esophagus without dysplasia: Secondary | ICD-10-CM | POA: Diagnosis not present

## 2017-01-30 DIAGNOSIS — K210 Gastro-esophageal reflux disease with esophagitis: Secondary | ICD-10-CM | POA: Diagnosis not present

## 2017-01-30 DIAGNOSIS — M1990 Unspecified osteoarthritis, unspecified site: Secondary | ICD-10-CM | POA: Diagnosis not present

## 2017-01-30 DIAGNOSIS — K648 Other hemorrhoids: Secondary | ICD-10-CM | POA: Diagnosis not present

## 2017-01-30 DIAGNOSIS — Z79899 Other long term (current) drug therapy: Secondary | ICD-10-CM | POA: Diagnosis not present

## 2017-01-30 DIAGNOSIS — K449 Diaphragmatic hernia without obstruction or gangrene: Secondary | ICD-10-CM | POA: Diagnosis not present

## 2017-01-30 DIAGNOSIS — K5730 Diverticulosis of large intestine without perforation or abscess without bleeding: Secondary | ICD-10-CM | POA: Diagnosis not present

## 2017-01-30 DIAGNOSIS — Z1211 Encounter for screening for malignant neoplasm of colon: Secondary | ICD-10-CM | POA: Diagnosis not present

## 2017-01-30 DIAGNOSIS — Z87891 Personal history of nicotine dependence: Secondary | ICD-10-CM | POA: Diagnosis not present

## 2017-01-30 DIAGNOSIS — E875 Hyperkalemia: Secondary | ICD-10-CM | POA: Diagnosis not present

## 2017-01-30 LAB — HM COLONOSCOPY

## 2017-02-04 ENCOUNTER — Encounter: Payer: Self-pay | Admitting: Family Medicine

## 2017-02-04 ENCOUNTER — Ambulatory Visit (INDEPENDENT_AMBULATORY_CARE_PROVIDER_SITE_OTHER): Payer: Medicare Other | Admitting: Family Medicine

## 2017-02-04 VITALS — BP 120/74 | HR 78 | Temp 97.6°F | Wt 196.2 lb

## 2017-02-04 DIAGNOSIS — L309 Dermatitis, unspecified: Secondary | ICD-10-CM

## 2017-02-04 DIAGNOSIS — K449 Diaphragmatic hernia without obstruction or gangrene: Secondary | ICD-10-CM | POA: Diagnosis not present

## 2017-02-04 DIAGNOSIS — K219 Gastro-esophageal reflux disease without esophagitis: Secondary | ICD-10-CM | POA: Diagnosis not present

## 2017-02-04 MED ORDER — DOXYCYCLINE HYCLATE 100 MG PO TABS: 100.0000 mg | ORAL_TABLET | Freq: Two times a day (BID) | ORAL | 0 refills | Status: DC

## 2017-02-04 NOTE — Patient Instructions (Signed)

## 2017-02-04 NOTE — Progress Notes (Signed)
Patient: Jacob Patterson Male    DOB: 01-Aug-1950   66 y.o.   MRN: [id number] Visit Date: 02/04/2017  Today's Provider: Vernie Murders, PA   Chief Complaint  Patient presents with  . Rash   Subjective:    Rash  This is a new problem. Episode onset: Saturday. The problem is unchanged. Location: right upper arm. Rash characteristics: red and circular. He was exposed to nothing. Treatments tried: Neosporin.   Patient Active Problem List   Diagnosis Date Noted  . Hilar adenopathy 04/03/2016  . Mediastinal adenopathy 03/24/2016  . Nodule of right lung 03/24/2016  . Elevated prostate specific antigen (PSA) 03/27/2015  . Hx of pulmonary infarction 03/27/2015  . Arthritis 10/31/2014  . 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 D-dimer, elevated 10/31/2014  . Abnormal prostate specific antigen 10/31/2014  . Acid reflux 10/31/2014  . Hypercholesteremia 10/31/2014  . Hernia, inguinal 10/31/2014  . Arthralgia of hip 10/31/2014  . Awareness of heartbeats 10/31/2014  . Pleural cavity effusion 10/31/2014  . Phlebitis after infusion 10/31/2014  . Calculus of kidney 10/31/2014  . CD (contact dermatitis) 02/01/2008  . Excessive urination at night 05/14/2007  . Can't get food down 04/11/2006   Past Surgical History:  Procedure Laterality Date  . CATARACT EXTRACTION W/PHACO Left 11/23/2015   Procedure: CATARACT EXTRACTION PHACO AND INTRAOCULAR LENS PLACEMENT (IOC);  Surgeon: Eulogio Bear, MD;  Location: [facility] ORS;  Service: Ophthalmology;  Laterality: Left;  Lot # [id number] H [place]: 01:03.8 AP%: 19.5 CDE: 12.46   . CATARACT EXTRACTION W/PHACO Right 02/29/2016   Procedure: CATARACT EXTRACTION PHACO AND INTRAOCULAR LENS PLACEMENT (IOC);  Surgeon: Eulogio Bear, MD;  Location: [facility] ORS;  Service: Ophthalmology;  Laterality: Right;  [place] 26.4 AP% 14.9 CDE 3.93 Fluid Pack Lot # [id number]  . COLONOSCOPY  2008  . kidney stent    . TONSILLECTOMY    . TONSILLECTOMY AND ADENOIDECTOMY  1960    Family History  Problem Relation Age of Onset  . Arthritis Mother   . Stroke Mother   . Arthritis Father   . Dementia Father   . Cataracts Father   . Hyperlipidemia Sister   . Breast cancer Paternal Grandmother   . Heart attack Paternal Grandfather    Allergies  Allergen Reactions  . Aleve [Naproxen]     Other reaction(s): Other (See Comments) Burning sensation, break out in sores     Previous Medications   ACETAMINOPHEN (TYLENOL) 500 MG TABLET    Take 1,000 mg by mouth.   ACETAMINOPHEN (TYLENOL) 650 MG CR TABLET    Take 650 mg by mouth every 8 (eight) hours as needed for pain.   APIXABAN (ELIQUIS) 5 MG TABS TABLET    Take 1 tablet (5 mg total) by mouth 2 (two) times daily.   ASPIRIN 81 MG TABLET    Take 81 mg by mouth daily.   B-COMPLEX TABS    Take 1 tablet by mouth every morning.   CHOLECALCIFEROL (VITAMIN D3) 2000 UNITS TABS    Take 1 tablet by mouth every morning.   FINASTERIDE (PROSCAR) 5 MG TABLET    Take 5 mg by mouth every morning.   MAGNESIUM PO    Take by mouth daily.   MELATONIN-PYRIDOXINE (MELATONEX PO)    Take 1 tablet by mouth at bedtime. 0.5   MULTIPLE VITAMINS-MINERALS (MULTIVITAMIN PO)    Take 1 tablet by mouth every morning.   NIACIN 500 MG TABLET    Take 500 mg by mouth at bedtime.  TAMSULOSIN (FLOMAX) 0.4 MG CAPS CAPSULE    Take 1 capsule (0.4 mg total) by mouth daily after supper.   TURMERIC PO    Take 1 capsule by mouth every morning.    Review of Systems  Constitutional: Negative.   Respiratory: Negative.   Cardiovascular: Negative.   Skin: Positive for rash.    Social History  Substance Use Topics  . Smoking status: Former Smoker    Types: Cigarettes  . Smokeless tobacco: Never Used     Comment: Quit in 1989  . Alcohol use 3.6 - 4.2 oz/week    6 - 7 Glasses of wine per week   Objective:   BP 120/74 (BP Location: Left Arm, Patient Position: Sitting, Cuff Size: Normal)   Pulse 78   Temp 97.6 F (36.4 C) (Oral)   Wt 196 lb 3.2 oz (89  kg)   SpO2 97%   BMI 30.73 kg/m   Physical Exam  Constitutional: He is oriented to person, place, and time. He appears well-developed and well-nourished. No distress.  HENT:  Head: Normocephalic and atraumatic.  Right Ear: Hearing normal.  Left Ear: Hearing normal.  Nose: Nose normal.  Eyes: Conjunctivae and lids are normal. Right eye exhibits no discharge. Left eye exhibits no discharge. No scleral icterus.  Cardiovascular: Normal rate and regular rhythm.   Pulmonary/Chest: Effort normal and breath sounds normal. No respiratory distress.  Abdominal: Soft. Bowel sounds are normal. He exhibits no mass. There is no tenderness. There is no guarding.  Slightly hyperactive bowel sounds (status post upper and lower endoscopy on 01-30-17).  Musculoskeletal: Normal range of motion.  Neurological: He is alert and oriented to person, place, and time.  Skin: Skin is intact. No lesion and no rash noted.  Circular 1.5 cm lesion with central scab on the right upper inner arm. No local lymphadenopathy or pruritus.   Psychiatric: He has a normal mood and affect. His speech is normal and behavior is normal. Thought content normal.      Assessment & Plan:     1. Dermatitis Circular lesion on the right upper inner arm started over the past 4-5 days. Has the appearance of a tick bite or fungal rash. No itching or fevers. May use Lotrimin cream and proceed with Doxycycline for possible tick borne disease. Recheck if no better or fever develops in the next 5-7 days. - doxycycline (VIBRA-TABS) 100 MG tablet; Take 1 tablet (100 mg total) by mouth 2 (two) times daily.  Dispense: 20 tablet; Refill: 0  2. Gastroesophageal reflux disease with hiatal hernia Had upper endoscopy on 01-30-17 at [facility] and found reflux esophagitis with pending biopsy for suspected Barrett's Esophagitis. A 4 cm Hiatal Hernia was noted and awaiting biopsy reports. Declines use of PPI unless biopsy shows need.

## 2017-02-13 ENCOUNTER — Encounter: Payer: Self-pay | Admitting: Family Medicine

## 2017-03-02 IMAGING — CT CT ANGIO CHEST
2 of 6 series · 18 of 46 positions shown · IV contrast (APPLIED)
Comparison: Same day chest radiograph and chest CT from 10/14/2014
whole trauma for coronal on coronal ala

CLINICAL DATA: Left-sided chest pain since 4444 hours. History of
PE. Denies shortness of breath.

EXAM:
CT ANGIOGRAPHY CHEST WITH CONTRAST
TECHNIQUE: Multidetector CT imaging of the chest was performed using the
standard protocol during bolus administration of intravenous
contrast. Multiplanar CT image reconstructions and MIPs were
obtained to evaluate the vascular anatomy.
CONTRAST:  75 mL of Isovue 370 IV

[Series 6: thins · axial · 0.78mm/px · z∈[-347,-82]mm · 16 of 291 slices shown]
[im 13/291  lung]
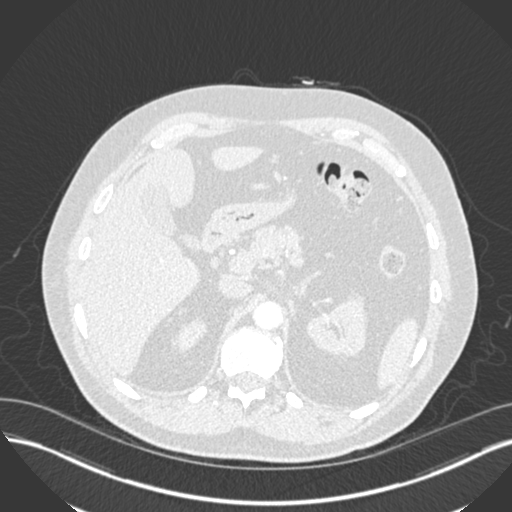
[im 38/291  soft-tissue]
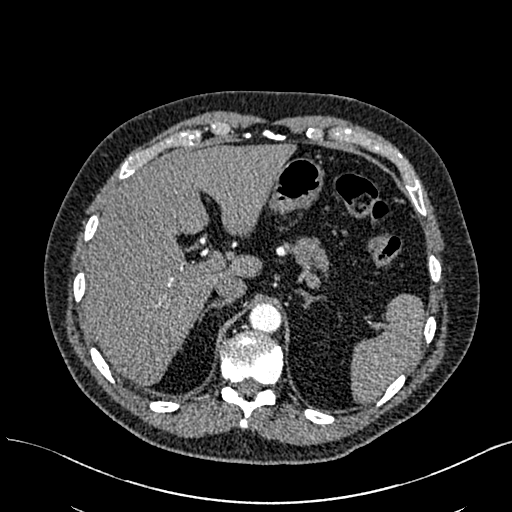
[im 51/291  lung]
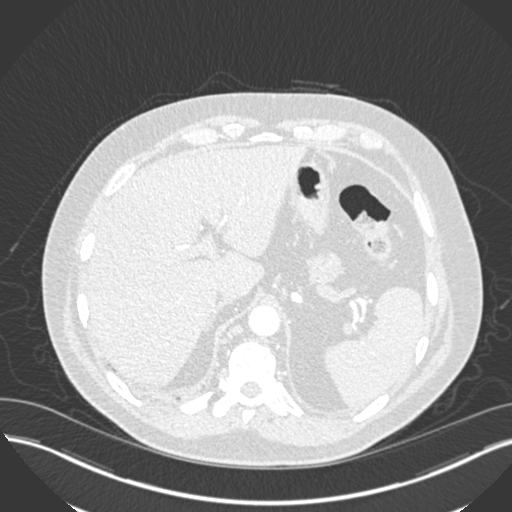
[im 64/291  soft-tissue]
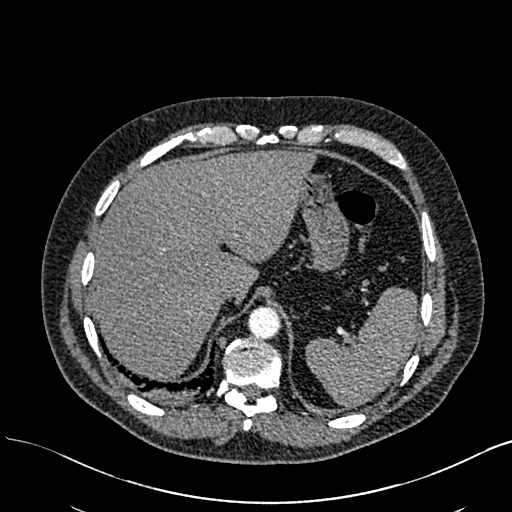
[im 89/291  lung]
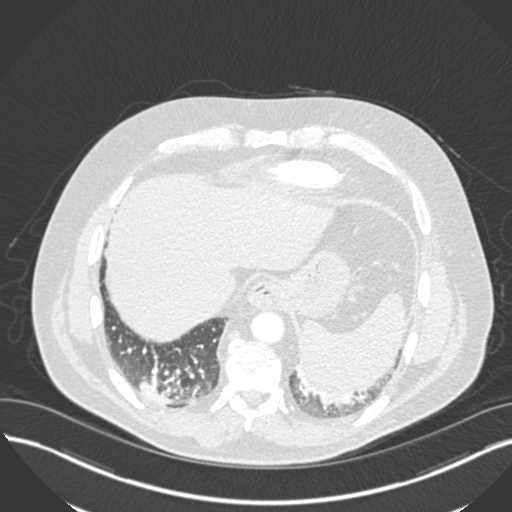
[im 101/291  soft-tissue]
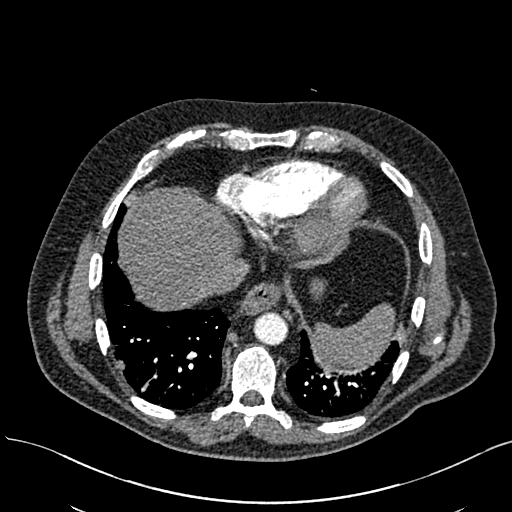
[im 114/291  lung]
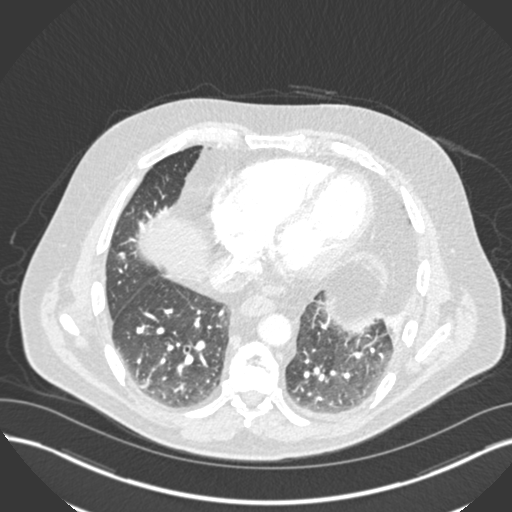
[im 139/291  soft-tissue]
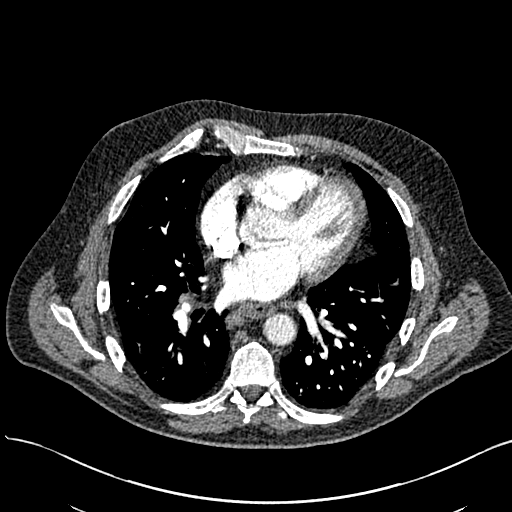
[im 152/291  lung]
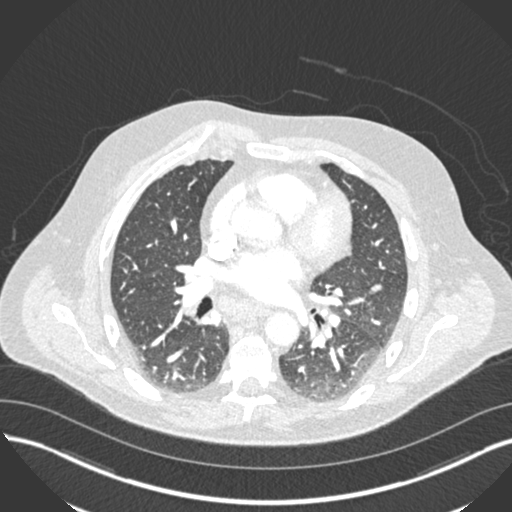
[im 177/291  soft-tissue]
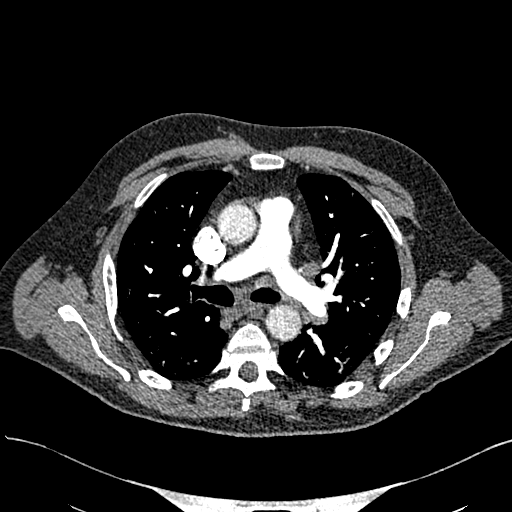
[im 190/291  lung]
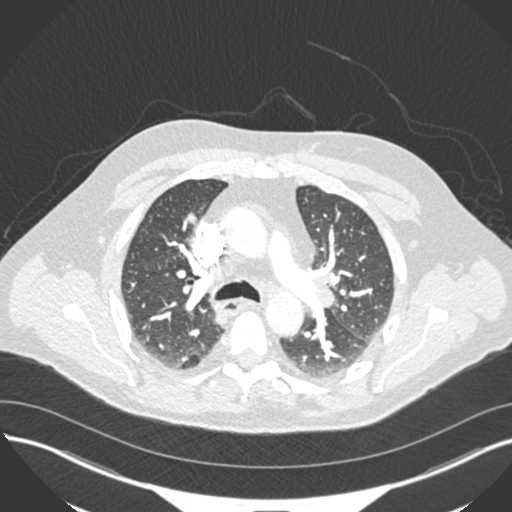
[im 202/291  soft-tissue]
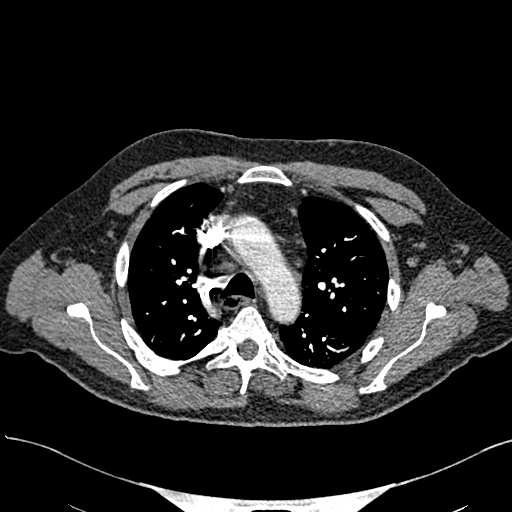
[im 227/291  lung]
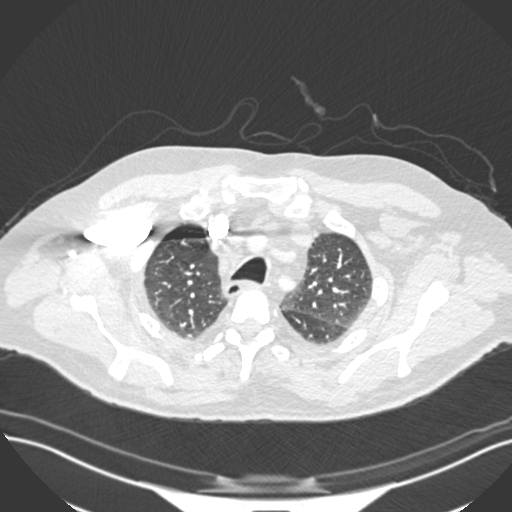
[im 240/291  soft-tissue]
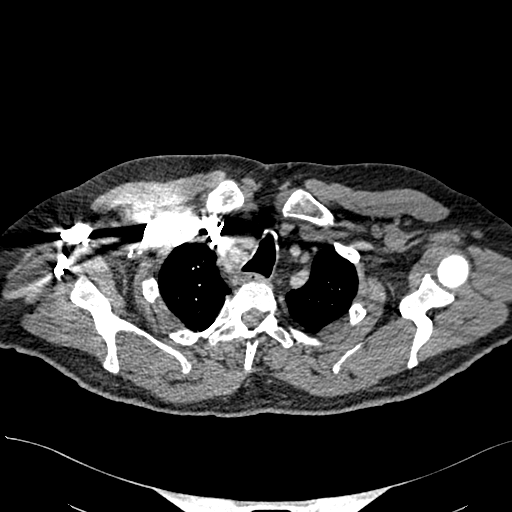
[im 253/291  lung]
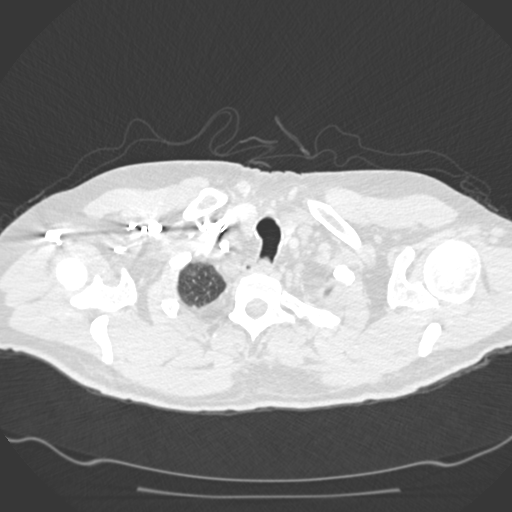
[im 278/291  soft-tissue]
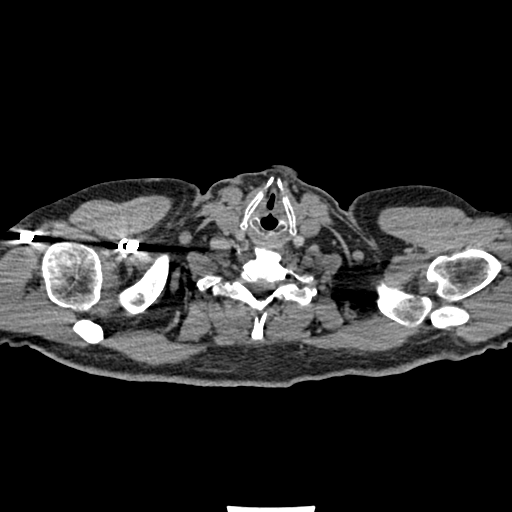

[Series 8: coronal mpr · coronal · 0.61mm/px · 2 of 91 slices shown]
[im 31/91  soft-tissue]
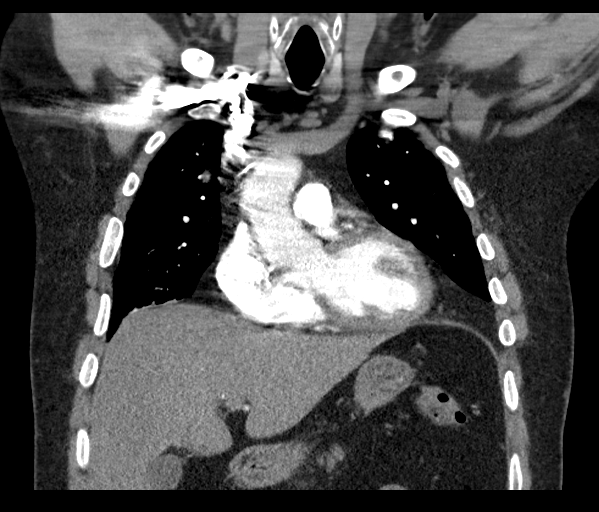
[im 61/91  soft-tissue]
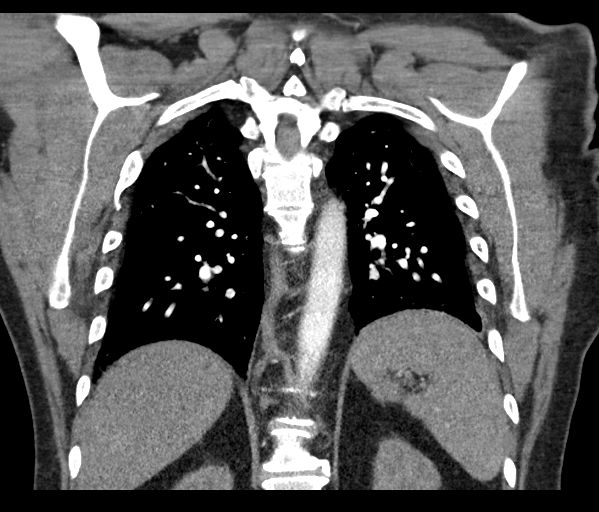

[18 of 46 positions shown; findings below may reference images not displayed]

FINDINGS: Cardiovascular: The study is of quality for the evaluation of
pulmonary embolism. There are no filling defects in the central,
lobar, segmental or subsegmental pulmonary artery branches to
suggest acute pulmonary embolism. Great vessels are normal in course
and caliber. Normal heart size. No significant pericardial
fluid/thickening. Mild prominence of epicardial fat.

Mediastinum/Nodes: No discrete thyroid nodules. Small sliding hiatal
hernia as before. Mediastinal and left hilar adenopathy is noted
which appear to have increased in size since prior exam for home.
Right upper paratracheal lymphadenopathy at 17 mm short axis
adjacent to a smaller 11 mm short axis lymph node with precarinal
2.1 cm short axis lymph node have increased in size with development
of left hilar lymphadenopathy since prior.

Lungs/Pleura: Spiculated right upper lobe pulmonary nodule measuring
8 mm in diameter is new, series 7, image 34. Subpleural densities in
the left upper lobe series 7, image 38 measuring on the order of 3
-4 mm each. Right middle lobe lateral segment ovoid density measures
approximately 9 by 6 mm. Bibasilar atelectasis is seen in both lower
lobes and lingula. No pneumothorax. No pleural effusion.

Upper abdomen: Unremarkable. No adrenal mass. No definite
space-occupying mass of the visualized liver.

Musculoskeletal: No aggressive appearing focal osseous lesions. Mild
mid thoracic kyphosis attributable to disc space narrowing.

Review of the MIP images confirms the above findings.
IMPRESSION: Mediastinal and left hilar lymphadenopathy associated with pulmonary
nodular densities in the right upper lobe, right middle lobe and
possibly subpleural left upper lobe largest up to 9 mm. Findings are
concerning for neoplasm. Recommend consultation with oncology.
PET-CT may prove useful from an imaging standpoint.

No large central pulmonary embolus.

## 2017-05-29 ENCOUNTER — Encounter: Payer: Self-pay | Admitting: Family Medicine

## 2017-05-29 ENCOUNTER — Ambulatory Visit (INDEPENDENT_AMBULATORY_CARE_PROVIDER_SITE_OTHER): Payer: Medicare Other | Admitting: Family Medicine

## 2017-05-29 VITALS — BP 120/70 | HR 71 | Temp 97.8°F | Ht 67.0 in | Wt 199.2 lb

## 2017-05-29 DIAGNOSIS — R972 Elevated prostate specific antigen [PSA]: Secondary | ICD-10-CM

## 2017-05-29 DIAGNOSIS — E7800 Pure hypercholesterolemia, unspecified: Secondary | ICD-10-CM

## 2017-05-29 DIAGNOSIS — Z23 Encounter for immunization: Secondary | ICD-10-CM | POA: Diagnosis not present

## 2017-05-29 DIAGNOSIS — K4090 Unilateral inguinal hernia, without obstruction or gangrene, not specified as recurrent: Secondary | ICD-10-CM

## 2017-05-29 NOTE — Progress Notes (Signed)
Patient: Jacob Patterson Male    DOB: [birth year]   66 y.o.   MRN: [id number] Visit Date: 05/29/2017  Today's Provider: Vernie Murders, PA   Chief Complaint  Patient presents with  . Recheck PSA  . Influenza vaccine   Subjective:    HPI Patient presents today requesting a influenza vaccine and routine labs including PSA. He states he was told by Urologist in the Spring to have PSA rechecked in 6-9 months. Last PSA- 10/23/16 at the [facility] was 9.28 (highest reading was 18.4 on 06/30/14). Patient denies any GU symptoms at this time.   He also reports ongoing left hip pain. He states he can control the pain with NSAIDS, but certain foods trigger the inflammation of the hip.    Past Medical History:  Diagnosis Date  . Arthritis   . Diverticulosis   . Dizziness    medication related  . Dizziness   . Environmental allergies   . GERD (gastroesophageal reflux disease)   . Hiatal hernia   . HOH (hard of hearing)   . Hypercholesteremia   . Kidney stones   . Murmur   . PE (pulmonary embolism)   . Wheezing    Past Surgical History:  Procedure Laterality Date  . CATARACT EXTRACTION W/PHACO Left 11/23/2015   Procedure: CATARACT EXTRACTION PHACO AND INTRAOCULAR LENS PLACEMENT (IOC);  Surgeon: Eulogio Bear, MD;  Location: [facility] ORS;  Service: Ophthalmology;  Laterality: Left;  Lot # [id number] H [place]: 01:03.8 AP%: 19.5 CDE: 12.46   . CATARACT EXTRACTION W/PHACO Right 02/29/2016   Procedure: CATARACT EXTRACTION PHACO AND INTRAOCULAR LENS PLACEMENT (IOC);  Surgeon: Eulogio Bear, MD;  Location: [facility] ORS;  Service: Ophthalmology;  Laterality: Right;  [place] 26.4 AP% 14.9 CDE 3.93 Fluid Pack Lot # [id number]  . COLONOSCOPY  2008  . kidney stent    . TONSILLECTOMY    . TONSILLECTOMY AND ADENOIDECTOMY  1960   Family History  Problem Relation Age of Onset  . Arthritis Mother   . Stroke Mother   . Arthritis Father   . Dementia Father   . Cataracts Father   .  Hyperlipidemia Sister   . Breast cancer Paternal Grandmother   . Heart attack Paternal Grandfather    Allergies  Allergen Reactions  . Naproxen     Other reaction(s): Other (See Comments) Burning sensation, break out in sores Other reaction(s): Other (See Comments) Burning sensation, break out in sores    Current Outpatient Medications:  .  acetaminophen (TYLENOL) 500 MG tablet, Take 1,000 mg by mouth., Disp: , Rfl:  .  acetaminophen (TYLENOL) 650 MG CR tablet, Take 650 mg by mouth every 8 (eight) hours as needed for pain., Disp: , Rfl:  .  aspirin 81 MG tablet, Take 81 mg by mouth daily., Disp: , Rfl:  .  B-Complex TABS, Take 1 tablet by mouth every morning., Disp: , Rfl:  .  Cholecalciferol (VITAMIN D3) 2000 UNITS TABS, Take 1 tablet by mouth every morning., Disp: , Rfl:  .  finasteride (PROSCAR) 5 MG tablet, Take 5 mg by mouth every morning., Disp: , Rfl:  .  MAGNESIUM PO, Take by mouth daily., Disp: , Rfl:  .  Melatonin-Pyridoxine (MELATONEX PO), Take 1 tablet by mouth at bedtime. 0.5, Disp: , Rfl:  .  Multiple Vitamins-Minerals (MULTIVITAMIN PO), Take 1 tablet by mouth every morning., Disp: , Rfl:  .  niacin 500 MG tablet, Take 500 mg by mouth at bedtime., Disp: ,  Rfl:  .  tamsulosin (FLOMAX) 0.4 MG CAPS capsule, Take 1 capsule (0.4 mg total) by mouth daily after supper., Disp: 30 capsule, Rfl: 3 .  TURMERIC PO, Take 1 capsule by mouth every morning., Disp: , Rfl:   Review of Systems  Constitutional: Negative.   Respiratory: Negative.   Cardiovascular: Negative.   Genitourinary: Negative for decreased urine volume, dysuria, hematuria and urgency.       History of BPH with nocturia 2-3 times a night.   Social History   Tobacco Use  . Smoking status: Former Smoker    Types: Cigarettes  . Smokeless tobacco: Never Used  . Tobacco comment: Quit in 1989  Substance Use Topics  . Alcohol use: Yes    Alcohol/week: 3.6 - 4.2 oz    Types: 6 - 7 Glasses of wine per week    Objective:   BP 120/70 (BP Location: Right Arm, Patient Position: Sitting, Cuff Size: Normal)   Pulse 71   Temp 97.8 F (36.6 C) (Oral)   Ht 5\' 7"  (1.702 m)   Wt 199 lb 3.2 oz (90.4 kg)   SpO2 96%   BMI 31.20 kg/m    Wt Readings from Last 3 Encounters:  05/29/17 199 lb 3.2 oz (90.4 kg)  02/04/17 196 lb 3.2 oz (89 kg)  10/08/16 200 lb 12.8 oz (91.1 kg)    Physical Exam  Constitutional: He is oriented to person, place, and time. He appears well-developed and well-nourished. No distress.  HENT:  Head: Normocephalic and atraumatic.  Right Ear: Hearing normal.  Left Ear: Hearing normal.  Nose: Nose normal.  Eyes: Conjunctivae and lids are normal. Right eye exhibits no discharge. Left eye exhibits no discharge. No scleral icterus.  Neck: Neck supple.  Cardiovascular: Normal rate and regular rhythm.  Pulmonary/Chest: Effort normal. No respiratory distress.  Abdominal: Soft. Bowel sounds are normal.  Right inguinal hernia large but easily reduced. No tenderness today.  Musculoskeletal: Normal range of motion.  Neurological: He is alert and oriented to person, place, and time.  Skin: Skin is intact. No lesion and no rash noted.  Psychiatric: He has a normal mood and affect. His speech is normal and behavior is normal. Thought content normal.      Assessment & Plan:     1. Elevated prostate specific antigen (PSA) Follow up of elevated PSA. Last check on 03-29-17 was 9.5. Has been treated with Flomax and Proscar since May 2018 by [facility] oncologist (Dr. Thomos Lemons). Will recheck PSA and renal function. MRI in 2016 confirmed BPH. Continue follow up with oncologist. - COMPLETE METABOLIC PANEL WITH GFR - PSA  2. Hypercholesteremia Trying to follow a low fat diet and exercise regularly. Using OTC supplements and herbs to try to bring levels under control. Recheck Lipid panel to assess progress. Wants to stay away from statins if possible. - Lipid panel  3. Right inguinal hernia   Asymptomatic today. Only has some soreness occasionally. Counseled regarding incarceration and surgical evaluation for repair. States he will let us know when he is ready.  4. Need for influenza vaccination - Flu vaccine HIGH DOSE PF       Vernie Murders, PA  [facility]

## 2017-06-02 DIAGNOSIS — R972 Elevated prostate specific antigen [PSA]: Secondary | ICD-10-CM | POA: Diagnosis not present

## 2017-06-02 DIAGNOSIS — E7800 Pure hypercholesterolemia, unspecified: Secondary | ICD-10-CM | POA: Diagnosis not present

## 2017-06-03 LAB — LIPID PANEL
Cholesterol: 209 mg/dL — ABNORMAL HIGH (ref <200)
HDL: 69 mg/dL (ref >40)
LDL CHOLESTEROL (CALC): 125 mg/dL — AB
NON-HDL CHOLESTEROL (CALC): 140 mg/dL — AB (ref <130)
TRIGLYCERIDES: 54 mg/dL (ref <150)
Total CHOL/HDL Ratio: 3 (calc) (ref <5.0)

## 2017-06-03 LAB — COMPLETE METABOLIC PANEL WITH GFR
AG Ratio: 2.3 (calc) (ref 1.0–2.5)
ALBUMIN MSPROF: 4.3 g/dL (ref 3.6–5.1)
ALT: 18 U/L (ref 9–46)
AST: 23 U/L (ref 10–35)
Alkaline phosphatase (APISO): 81 U/L (ref 40–115)
BUN / CREAT RATIO: 14 (calc) (ref 6–22)
BUN: 20 mg/dL (ref 7–25)
CO2: 28 mmol/L (ref 20–32)
CREATININE: 1.43 mg/dL — AB (ref 0.70–1.25)
Calcium: 9.2 mg/dL (ref 8.6–10.3)
Chloride: 107 mmol/L (ref 98–110)
GFR, EST AFRICAN AMERICAN: 58 mL/min/{1.73_m2} — AB (ref >=60)
GFR, Est Non African American: 50 mL/min/{1.73_m2} — ABNORMAL LOW (ref >=60)
GLUCOSE: 109 mg/dL — AB (ref 65–99)
Globulin: 1.9 g/dL (calc) (ref 1.9–3.7)
Potassium: 4.5 mmol/L (ref 3.5–5.3)
Sodium: 143 mmol/L (ref 135–146)
TOTAL PROTEIN: 6.2 g/dL (ref 6.1–8.1)
Total Bilirubin: 0.5 mg/dL (ref 0.2–1.2)

## 2017-06-03 LAB — PSA: PSA: 5.4 ng/mL — ABNORMAL HIGH (ref <=4.0)

## 2017-06-14 ENCOUNTER — Telehealth: Payer: Self-pay

## 2017-06-14 NOTE — Telephone Encounter (Signed)
-----   Message from Margo Common, [place] sent at 06/05/2017  8:34 AM EST ----- PSA much improved with use of Finasteride and Tamsulosin. Kidney function shows some worsening of creatinine. Cholesterol still high. Need to follow a low fat diet and increase water intake. Recheck progress of kidney function in 1 month.

## 2017-06-14 NOTE — Telephone Encounter (Signed)
Patient advised as below. Patient verbalizes understanding and is in agreement with treatment plan.  

## 2017-07-29 ENCOUNTER — Other Ambulatory Visit: Payer: Self-pay

## 2017-07-29 ENCOUNTER — Ambulatory Visit: Payer: Medicare Other | Admitting: Family Medicine

## 2017-07-29 DIAGNOSIS — R7989 Other specified abnormal findings of blood chemistry: Secondary | ICD-10-CM

## 2017-07-29 NOTE — Progress Notes (Signed)
Lab slip printed for a repeat CMET. Patient advised to have labs rechecked in 1 month due to kidney strain.

## 2017-07-30 DIAGNOSIS — R7989 Other specified abnormal findings of blood chemistry: Secondary | ICD-10-CM | POA: Diagnosis not present

## 2017-07-31 LAB — COMPREHENSIVE METABOLIC PANEL
A/G RATIO: 2 (ref 1.2–2.2)
ALBUMIN: 4.1 g/dL (ref 3.6–4.8)
ALT: 20 IU/L (ref 0–44)
AST: 27 IU/L (ref 0–40)
Alkaline Phosphatase: 93 IU/L (ref 39–117)
BUN / CREAT RATIO: 11 (ref 10–24)
BUN: 15 mg/dL (ref 8–27)
Bilirubin Total: 0.4 mg/dL (ref 0.0–1.2)
CALCIUM: 9.5 mg/dL (ref 8.6–10.2)
CO2: 21 mmol/L (ref 20–29)
CREATININE: 1.36 mg/dL — AB (ref 0.76–1.27)
Chloride: 105 mmol/L (ref 96–106)
GFR calc Af Amer: 61 mL/min/{1.73_m2} (ref >59)
GFR, EST NON AFRICAN AMERICAN: 53 mL/min/{1.73_m2} — AB (ref >59)
Globulin, Total: 2.1 g/dL (ref 1.5–4.5)
Glucose: 87 mg/dL (ref 65–99)
POTASSIUM: 4.7 mmol/L (ref 3.5–5.2)
Sodium: 144 mmol/L (ref 134–144)
Total Protein: 6.2 g/dL (ref 6.0–8.5)

## 2017-09-17 DIAGNOSIS — M1612 Unilateral primary osteoarthritis, left hip: Secondary | ICD-10-CM | POA: Diagnosis not present

## 2017-09-17 DIAGNOSIS — M25551 Pain in right hip: Secondary | ICD-10-CM | POA: Diagnosis not present

## 2017-09-24 DIAGNOSIS — M25552 Pain in left hip: Secondary | ICD-10-CM | POA: Diagnosis not present

## 2017-09-24 DIAGNOSIS — M25551 Pain in right hip: Secondary | ICD-10-CM | POA: Diagnosis not present

## 2017-10-02 DIAGNOSIS — M25551 Pain in right hip: Secondary | ICD-10-CM | POA: Diagnosis not present

## 2017-10-02 DIAGNOSIS — M25552 Pain in left hip: Secondary | ICD-10-CM | POA: Diagnosis not present

## 2017-10-07 DIAGNOSIS — M25552 Pain in left hip: Secondary | ICD-10-CM | POA: Diagnosis not present

## 2017-10-07 DIAGNOSIS — M25551 Pain in right hip: Secondary | ICD-10-CM | POA: Diagnosis not present

## 2017-10-09 ENCOUNTER — Ambulatory Visit (INDEPENDENT_AMBULATORY_CARE_PROVIDER_SITE_OTHER): Payer: Medicare Other

## 2017-10-09 VITALS — BP 114/56 | HR 96 | Temp 99.0°F | Ht 67.0 in | Wt 189.4 lb

## 2017-10-09 DIAGNOSIS — Z0000 Encounter for general adult medical examination without abnormal findings: Secondary | ICD-10-CM | POA: Diagnosis not present

## 2017-10-09 NOTE — Progress Notes (Addendum)
Subjective:   Jacob Patterson is a 67 y.o. male who presents for Medicare Annual/Subsequent preventive examination.  Review of Systems:  N/A  Cardiac Risk Factors include: advanced age (>63men, >76 women);dyslipidemia;male gender     Objective:    Vitals: BP (!) 114/56 (BP Location: Right Arm)   Pulse 96   Temp 99 F (37.2 C) (Oral)   Ht 5\' 7"  (1.702 m)   Wt 189 lb 6.4 oz (85.9 kg)   BMI 29.66 kg/m   Body mass index is 29.66 kg/m.  Advanced Directives 10/09/2017 10/08/2016 03/24/2016 02/29/2016 11/23/2015 12/06/2014 10/14/2014  Does Patient Have a Medical Advance Directive? No No No No No No No  Would patient like information on creating a medical advance directive? No - Patient declined Yes (ED - Information included in AVS) - No - patient declined information - - -    Tobacco Social History   Tobacco Use  Smoking Status Former Smoker  . Types: Cigarettes  Smokeless Tobacco Never Used  Tobacco Comment   Quit in 1989     Counseling given: Not Answered Comment: Quit in 1989   Clinical Intake:  Pre-visit preparation completed: Yes  Pain : 0-10 Pain Score: 4  Pain Type: Chronic pain Pain Location: Hip Pain Orientation: Left Pain Descriptors / Indicators: Sharp Pain Frequency: Constant     Nutritional Status: BMI 25 -29 Overweight Nutritional Risks: None Diabetes: No  How often do you need to have someone help you when you read instructions, pamphlets, or other written materials from your doctor or pharmacy?: 1 - Never  Interpreter Needed?: No  Information entered by :: [facility], LPN  Past Medical History:  Diagnosis Date  . Arthritis   . Diverticulosis   . Dizziness    medication related  . Dizziness   . Environmental allergies   . GERD (gastroesophageal reflux disease)   . Hiatal hernia   . HOH (hard of hearing)   . Hypercholesteremia   . Kidney stones   . Murmur   . PE (pulmonary embolism)   . Wheezing    Past Surgical History:  Procedure  Laterality Date  . CATARACT EXTRACTION W/PHACO Left 11/23/2015   Procedure: CATARACT EXTRACTION PHACO AND INTRAOCULAR LENS PLACEMENT (IOC);  Surgeon: Eulogio Bear, MD;  Location: [facility] ORS;  Service: Ophthalmology;  Laterality: Left;  Lot # [id number] H [place]: 01:03.8 AP%: 19.5 CDE: 12.46   . CATARACT EXTRACTION W/PHACO Right 02/29/2016   Procedure: CATARACT EXTRACTION PHACO AND INTRAOCULAR LENS PLACEMENT (IOC);  Surgeon: Eulogio Bear, MD;  Location: [facility] ORS;  Service: Ophthalmology;  Laterality: Right;  [place] 26.4 AP% 14.9 CDE 3.93 Fluid Pack Lot # [id number]  . COLONOSCOPY  2008  . kidney stent    . TONSILLECTOMY    . TONSILLECTOMY AND ADENOIDECTOMY  1960   Family History  Problem Relation Age of Onset  . Arthritis Mother   . Stroke Mother   . Arthritis Father   . Dementia Father   . Cataracts Father   . Hyperlipidemia Sister   . Breast cancer Paternal Grandmother   . Heart attack Paternal Grandfather    Social History   Socioeconomic History  . Marital status: Single    Spouse name: Not on file  . Number of children: 0  . Years of education: Not on file  . Highest education level: Master's degree (e.g., MA, MS, MEng, MEd, MSW, MBA)  Occupational History  . Occupation: Development worker, [employer] part time    Comment: retired  Scientific laboratory technician  .  Financial resource strain: Not hard at all  . Food insecurity:    Worry: Never true    Inability: Never true  . Transportation needs:    Medical: No    Non-medical: No  Tobacco Use  . Smoking status: Former Smoker    Types: Cigarettes  . Smokeless tobacco: Never Used  . Tobacco comment: Quit in 1989  Substance and Sexual Activity  . Alcohol use: Yes    Alcohol/week: 1.8 - 2.4 oz    Types: 3 - 4 Glasses of wine per week  . Drug use: No  . Sexual activity: Not on file  Lifestyle  . Physical activity:    Days per week: Not on file    Minutes per session: Not on file  . Stress: Not at all  Relationships  . Social connections:    Talks on  phone: Not on file    Gets together: Not on file    Attends religious service: Not on file    Active member of club or organization: Not on file    Attends meetings of clubs or organizations: Not on file    Relationship status: Not on file  Other Topics Concern  . Not on file  Social History Narrative  . Not on file    Outpatient Encounter Medications as of 10/09/2017  Medication Sig  . acetaminophen (TYLENOL) 650 MG CR tablet Take 650 mg by mouth every 8 (eight) hours as needed for pain.   Marland Kitchen B-Complex TABS Take 1 tablet by mouth every morning.  . Cholecalciferol (VITAMIN D3) 2000 UNITS TABS Take 1 tablet by mouth every morning.  . finasteride (PROSCAR) 5 MG tablet Take 5 mg by mouth every morning.  Javier Docker Oil 350 MG CAPS Take 350 mg by mouth daily.  Marland Kitchen MAGNESIUM PO Take 400 mg by mouth daily.   . Melatonin 1 MG TABS Take 1 mg by mouth at bedtime. Takes 1/2 tablet  . Multiple Vitamins-Minerals (MULTIVITAMIN PO) Take 1 tablet by mouth every morning.  . niacin 500 MG tablet Take 500 mg by mouth at bedtime.  . tamsulosin (FLOMAX) 0.4 MG CAPS capsule Take 1 capsule (0.4 mg total) by mouth daily after supper.  Marland Kitchen acetaminophen (TYLENOL) 500 MG tablet Take 1,000 mg by mouth every 6 (six) hours as needed.   Marland Kitchen aspirin 81 MG tablet Take 81 mg by mouth daily.  . Melatonin-Pyridoxine (MELATONEX PO) Take 1 tablet by mouth at bedtime. 0.5   . TURMERIC PO Take 1 capsule by mouth every morning.   No facility-administered encounter medications on file as of 10/09/2017.     Activities of Daily Living In your present state of health, do you have any difficulty performing the following activities: 10/09/2017  Hearing? Y  Comment Slightly with certain pitches. Pt declines hearing aids.   Vision? N  Difficulty concentrating or making decisions? N  Walking or climbing stairs? Y  Comment Due to hip pain.   Dressing or bathing? N  Comment Has trouble putting on his left sock.  Doing errands, shopping? N    Preparing Food and eating ? N  Using the Toilet? N  In the past six months, have you accidently leaked urine? N  Do you have problems with loss of bowel control? N  Managing your Medications? N  Managing your Finances? N  Housekeeping or managing your Housekeeping? N  Some recent data might be hidden    Patient Care Team: Chrismon, Vickki Muff, PA as PCP - General (  Physician Assistant) Edison Pace, Josie Saunders, MD as Consulting Physician (Ophthalmology) Tana Felts, MD as Referring Physician Carlis Abbott, Ferdinand Cava, PA as Consulting Physician (Physician Assistant) Brain Hilts, MD as Referring Physician (Gastroenterology)   Assessment:   This is a routine wellness examination for [place].  Exercise Activities and Dietary recommendations Current Exercise Habits: Home exercise routine(PT exercises), Type of exercise: stretching, Time (Minutes): 30, Frequency (Times/Week): 7, Weekly Exercise (Minutes/Week): 210, Intensity: Mild, Exercise limited by: orthopedic condition(s)  Goals    . DIET - INCREASE WATER INTAKE     Recommend increasing water intake to 4-6 glasses a day.       Fall Risk Fall Risk  10/09/2017 10/08/2016  Falls in the past year? Yes Yes  Number falls in past yr: 1 1  Injury with Fall? No No  Follow up Falls prevention discussed -   Is the patient's home free of loose throw rugs in walkways, pet beds, electrical cords, etc?   yes      Grab bars in the bathroom? no      Handrails on the stairs?   yes      Adequate lighting?   yes  Timed Get Up and Go Performed: N/A  Depression Screen PHQ 2/9 Scores 10/09/2017 10/08/2016 10/08/2016  PHQ - 2 Score 0 0 0  PHQ- 9 Score - 4 -    Cognitive Function     6CIT Screen 10/09/2017 10/08/2016  What Year? 0 points 0 points  What month? 0 points 0 points  What time? 0 points 0 points  Count back from 20 0 points 0 points  Months in reverse 0 points 2 points  Repeat phrase 0 points 0 points  Total Score 0 2    Immunization  History  Administered Date(s) Administered  . Influenza, High Dose Seasonal PF 06/28/2014, 05/29/2017  . Influenza,inj,Quad PF,6+ Mos 04/27/2013, 03/28/2016  . Influenza-Unspecified 06/08/2015  . Pneumococcal Conjugate-13 06/28/2014  . Pneumococcal Polysaccharide-23 05/06/2016  . Td 09/06/2009    Qualifies for Shingles Vaccine? Due for Shingles vaccine. Declined my offer to administer today. Education has been provided regarding the importance of this vaccine. Pt has been advised to call her insurance company to determine her out of pocket expense. Advised she may also receive this vaccine at her local pharmacy or Health Dept. Verbalized acceptance and understanding.  Screening Tests Health Maintenance  Topic Date Due  . INFLUENZA VACCINE  01/08/2018  . TETANUS/TDAP  09/07/2019  . COLONOSCOPY  01/31/2027  . Hepatitis C Screening  Completed  . PNA vac Low Risk Adult  Completed   Cancer Screenings: Lung: Low Dose CT Chest recommended if Age 46-80 years, 30 pack-year currently smoking OR have quit w/in 15years. Patient does not qualify. Colorectal: Up to date  Additional Screenings:  Hepatitis C Screening: N/A      Plan:  I have personally reviewed and addressed the Medicare Annual Wellness questionnaire and have noted the following in the patient's chart:  A. Medical and social history B. Use of alcohol, tobacco or illicit drugs  C. Current medications and supplements D. Functional ability and status E.  Nutritional status F.  Physical activity G. Advance directives H. List of other physicians I.  Hospitalizations, surgeries, and ER visits in previous 12 months J.  [place] such as hearing and vision if needed, cognitive and depression L. Referrals and appointments - none  In addition, I have reviewed and discussed with patient certain preventive protocols, quality metrics, and best practice recommendations.  A written personalized care plan for preventive  services as well as general preventive health recommendations were provided to patient.  See attached scanned questionnaire for additional information.   Signed,  Fabio Neighbors, LPN Nurse Health Advisor   Nurse Recommendations: None.  Reviewed Nurse Health Advisor's documentation of screening and recommendations. Was available for consultation. Agree with note and recommendations.

## 2017-10-09 NOTE — Patient Instructions (Signed)
Jacob Patterson , Thank you for taking time to come for your Medicare Wellness Visit. I appreciate your ongoing commitment to your health goals. Please review the following plan we discussed and let me know if I can assist you in the future.   Screening recommendations/referrals: Colonoscopy: Up to date Recommended yearly ophthalmology/optometry visit for glaucoma screening and checkup Recommended yearly dental visit for hygiene and checkup  Vaccinations: Influenza vaccine: Up to date Pneumococcal vaccine: Up to date Tdap vaccine: Up to date Shingles vaccine: Pt declines today.     Advanced directives: Advance directive discussed with you today. Even though you declined this today please call our office should you change your mind and we can give you the proper paperwork for you to fill out.  Conditions/risks identified: Recommend increasing water intake to 4-6 glasses a day.  Next appointment: 10/21/17 @ 2 PM with Simona Huh Chrismon.  Preventive Care 41 Years and Older, Male Preventive care refers to lifestyle choices and visits with your health care provider that can promote health and wellness. What does preventive care include?  A yearly physical exam. This is also called an annual well check.  Dental exams once or twice a year.  Routine eye exams. Ask your health care provider how often you should have your eyes checked.  Personal lifestyle choices, including:  Daily care of your teeth and gums.  Regular physical activity.  Eating a healthy diet.  Avoiding tobacco and drug use.  Limiting alcohol use.  Practicing safe sex.  Taking low doses of aspirin every day.  Taking vitamin and mineral supplements as recommended by your health care provider. What happens during an annual well check? The services and screenings done by your health care provider during your annual well check will depend on your age, overall health, lifestyle risk factors, and family history of  disease. Counseling  Your health care provider may ask you questions about your:  Alcohol use.  Tobacco use.  Drug use.  Emotional well-being.  Home and relationship well-being.  Sexual activity.  Eating habits.  History of falls.  Memory and ability to understand (cognition).  Work and work Statistician. Screening  You may have the following tests or measurements:  Height, weight, and BMI.  Blood pressure.  Lipid and cholesterol levels. These may be checked every 5 years, or more frequently if you are over 44 years old.  Skin check.  Lung cancer screening. You may have this screening every year starting at age 63 if you have a 30-pack-year history of smoking and currently smoke or have quit within the past 15 years.  Fecal occult blood test (FOBT) of the stool. You may have this test every year starting at age 36.  Flexible sigmoidoscopy or colonoscopy. You may have a sigmoidoscopy every 5 years or a colonoscopy every 10 years starting at age 15.  Prostate cancer screening. Recommendations will vary depending on your family history and other risks.  Hepatitis C blood test.  Hepatitis B blood test.  Sexually transmitted disease (STD) testing.  Diabetes screening. This is done by checking your blood sugar (glucose) after you have not eaten for a while (fasting). You may have this done every 1-3 years.  Abdominal aortic aneurysm (AAA) screening. You may need this if you are a current or former smoker.  Osteoporosis. You may be screened starting at age 36 if you are at high risk. Talk with your health care provider about your test results, treatment options, and if necessary, the need for  more tests. Vaccines  Your health care provider may recommend certain vaccines, such as:  Influenza vaccine. This is recommended every year.  Tetanus, diphtheria, and acellular pertussis (Tdap, Td) vaccine. You may need a Td booster every 10 years.  Zoster vaccine. You may  need this after age 24.  Pneumococcal 13-valent conjugate (PCV13) vaccine. One dose is recommended after age 43.  Pneumococcal polysaccharide (PPSV23) vaccine. One dose is recommended after age 47. Talk to your health care provider about which screenings and vaccines you need and how often you need them. This information is not intended to replace advice given to you by your health care provider. Make sure you discuss any questions you have with your health care provider. Document Released: 06/23/2015 Document Revised: 02/14/2016 Document Reviewed: 03/28/2015 Elsevier Interactive Patient Education  2017 [place] Prevention in the Home Falls can cause injuries. They can happen to people of all ages. There are many things you can do to make your home safe and to help prevent falls. What can I do on the outside of my home?  Regularly fix the edges of walkways and driveways and fix any cracks.  Remove anything that might make you trip as you walk through a door, such as a raised step or threshold.  Trim any bushes or trees on the path to your home.  Use bright outdoor lighting.  Clear any walking paths of anything that might make someone trip, such as rocks or tools.  Regularly check to see if handrails are loose or broken. Make sure that both sides of any steps have handrails.  Any raised decks and porches should have guardrails on the edges.  Have any leaves, snow, or ice cleared regularly.  Use sand or salt on walking paths during winter.  Clean up any spills in your garage right away. This includes oil or grease spills. What can I do in the bathroom?  Use night lights.  Install grab bars by the toilet and in the tub and shower. Do not use towel bars as grab bars.  Use non-skid mats or decals in the tub or shower.  If you need to sit down in the shower, use a plastic, non-slip stool.  Keep the floor dry. Clean up any water that spills on the floor as soon as it  happens.  Remove soap buildup in the tub or shower regularly.  Attach bath mats securely with double-sided non-slip rug tape.  Do not have throw rugs and other things on the floor that can make you trip. What can I do in the bedroom?  Use night lights.  Make sure that you have a light by your bed that is easy to reach.  Do not use any sheets or blankets that are too big for your bed. They should not hang down onto the floor.  Have a firm chair that has side arms. You can use this for support while you get dressed.  Do not have throw rugs and other things on the floor that can make you trip. What can I do in the kitchen?  Clean up any spills right away.  Avoid walking on wet floors.  Keep items that you use a lot in easy-to-reach places.  If you need to reach something above you, use a strong step stool that has a grab bar.  Keep electrical cords out of the way.  Do not use floor polish or wax that makes floors slippery. If you must use wax, use non-skid  floor wax.  Do not have throw rugs and other things on the floor that can make you trip. What can I do with my stairs?  Do not leave any items on the stairs.  Make sure that there are handrails on both sides of the stairs and use them. Fix handrails that are broken or loose. Make sure that handrails are as long as the stairways.  Check any carpeting to make sure that it is firmly attached to the stairs. Fix any carpet that is loose or worn.  Avoid having throw rugs at the top or bottom of the stairs. If you do have throw rugs, attach them to the floor with carpet tape.  Make sure that you have a light switch at the top of the stairs and the bottom of the stairs. If you do not have them, ask someone to add them for you. What else can I do to help prevent falls?  Wear shoes that:  Do not have high heels.  Have rubber bottoms.  Are comfortable and fit you well.  Are closed at the toe. Do not wear sandals.  If you  use a stepladder:  Make sure that it is fully opened. Do not climb a closed stepladder.  Make sure that both sides of the stepladder are locked into place.  Ask someone to hold it for you, if possible.  Clearly mark and make sure that you can see:  Any grab bars or handrails.  First and last steps.  Where the edge of each step is.  Use tools that help you move around (mobility aids) if they are needed. These include:  Canes.  Walkers.  Scooters.  Crutches.  Turn on the lights when you go into a dark area. Replace any light bulbs as soon as they burn out.  Set up your furniture so you have a clear path. Avoid moving your furniture around.  If any of your floors are uneven, fix them.  If there are any pets around you, be aware of where they are.  Review your medicines with your doctor. Some medicines can make you feel dizzy. This can increase your chance of falling. Ask your doctor what other things that you can do to help prevent falls. This information is not intended to replace advice given to you by your health care provider. Make sure you discuss any questions you have with your health care provider. Document Released: 03/23/2009 Document Revised: 11/02/2015 Document Reviewed: 07/01/2014 Elsevier Interactive Patient Education  2017 [employer].

## 2017-10-15 DIAGNOSIS — M25552 Pain in left hip: Secondary | ICD-10-CM | POA: Diagnosis not present

## 2017-10-15 DIAGNOSIS — M25551 Pain in right hip: Secondary | ICD-10-CM | POA: Diagnosis not present

## 2017-10-21 ENCOUNTER — Encounter: Payer: Self-pay | Admitting: Family Medicine

## 2017-10-21 ENCOUNTER — Ambulatory Visit (INDEPENDENT_AMBULATORY_CARE_PROVIDER_SITE_OTHER): Payer: Medicare Other | Admitting: Family Medicine

## 2017-10-21 VITALS — BP 108/68 | HR 84 | Temp 97.7°F | Ht 67.0 in | Wt 187.8 lb

## 2017-10-21 DIAGNOSIS — R972 Elevated prostate specific antigen [PSA]: Secondary | ICD-10-CM

## 2017-10-21 DIAGNOSIS — Z0000 Encounter for general adult medical examination without abnormal findings: Secondary | ICD-10-CM | POA: Diagnosis not present

## 2017-10-21 DIAGNOSIS — E7800 Pure hypercholesterolemia, unspecified: Secondary | ICD-10-CM | POA: Diagnosis not present

## 2017-10-21 NOTE — Progress Notes (Signed)
Patient: Jacob Patterson, Male    DOB: [birth year], 67 y.o.   MRN: [id number] Visit Date: 10/21/2017  Today's Provider: Vernie Murders, PA   Chief Complaint  Patient presents with  . Annual Exam   Subjective:    Annual physical exam Jacob Patterson is a 67 y.o. male who presents today for health maintenance and complete physical. He feels fairly well. He reports exercising daily 30-45 minutes and he is doing PT for hip pain. He reports he is sleeping well.  ----------------------------------------------------------------- Patient had a AWE on 10/09/17      Review of Systems  Constitutional: Negative.   HENT: Negative.   Eyes: Negative.   Respiratory: Negative.   Cardiovascular: Negative.   Gastrointestinal: Negative.   Endocrine: Negative.   Genitourinary: Negative.   Musculoskeletal: Positive for arthralgias.  Skin: Negative.   Allergic/Immunologic: Negative.   Neurological: Negative.   Hematological: Negative.   Psychiatric/Behavioral: Negative.     Social History      He  reports that he has quit smoking. His smoking use included cigarettes. He has never used smokeless tobacco. He reports that he drinks about 1.8 - 2.4 oz of alcohol per week. He reports that he does not use drugs.       Social History   Socioeconomic History  . Marital status: Single    Spouse name: Not on file  . Number of children: 0  . Years of education: Not on file  . Highest education level: Master's degree (e.g., MA, MS, MEng, MEd, MSW, MBA)  Occupational History  . Occupation: Development worker, international aid part time    Comment: retired  Scientific laboratory technician  . Financial resource strain: Not hard at all  . Food insecurity:    Worry: Never true    Inability: Never true  . Transportation needs:    Medical: No    Non-medical: No  Tobacco Use  . Smoking status: Former Smoker    Types: Cigarettes  . Smokeless tobacco: Never Used  . Tobacco comment: Quit in 1989  Substance and Sexual Activity  . Alcohol use:  Yes    Alcohol/week: 1.8 - 2.4 oz    Types: 3 - 4 Glasses of wine per week  . Drug use: No  . Sexual activity: Not on file  Lifestyle  . Physical activity:    Days per week: Not on file    Minutes per session: Not on file  . Stress: Not at all  Relationships  . Social connections:    Talks on phone: Not on file    Gets together: Not on file    Attends religious service: Not on file    Active member of club or organization: Not on file    Attends meetings of clubs or organizations: Not on file    Relationship status: Not on file  Other Topics Concern  . Not on file  Social History Narrative  . Not on file    Past Medical History:  Diagnosis Date  . Arthritis   . Diverticulosis   . Dizziness    medication related  . Dizziness   . Environmental allergies   . GERD (gastroesophageal reflux disease)   . Hiatal hernia   . HOH (hard of hearing)   . Hypercholesteremia   . Kidney stones   . Murmur   . PE (pulmonary embolism)   . Wheezing      Patient Active Problem List   Diagnosis Date Noted  . Hilar adenopathy 04/03/2016  .  Mediastinal adenopathy 03/24/2016  . Nodule of right lung 03/24/2016  . Elevated prostate specific antigen (PSA) 03/27/2015  . Hx of pulmonary infarction 03/27/2015  . Arthritis 10/31/2014  . 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 D-dimer, elevated 10/31/2014  . Abnormal prostate specific antigen 10/31/2014  . Acid reflux 10/31/2014  . Hypercholesteremia 10/31/2014  . Hernia, inguinal 10/31/2014  . Arthralgia of hip 10/31/2014  . Awareness of heartbeats 10/31/2014  . Pleural cavity effusion 10/31/2014  . Phlebitis after infusion 10/31/2014  . Calculus of kidney 10/31/2014  . CD (contact dermatitis) 02/01/2008  . Excessive urination at night 05/14/2007  . Can't get food down 04/11/2006    Past Surgical History:  Procedure Laterality Date  . CATARACT EXTRACTION W/PHACO Left 11/23/2015   Procedure: CATARACT EXTRACTION PHACO AND  INTRAOCULAR LENS PLACEMENT (IOC);  Surgeon: Eulogio Bear, MD;  Location: [facility] ORS;  Service: Ophthalmology;  Laterality: Left;  Lot # [id number] H [place]: 01:03.8 AP%: 19.5 CDE: 12.46   . CATARACT EXTRACTION W/PHACO Right 02/29/2016   Procedure: CATARACT EXTRACTION PHACO AND INTRAOCULAR LENS PLACEMENT (IOC);  Surgeon: Eulogio Bear, MD;  Location: [facility] ORS;  Service: Ophthalmology;  Laterality: Right;  [place] 26.4 AP% 14.9 CDE 3.93 Fluid Pack Lot # [id number]  . COLONOSCOPY  2008  . kidney stent    . TONSILLECTOMY    . TONSILLECTOMY AND ADENOIDECTOMY  1960    Family History        Family Status  Relation Name Status  . Mother  Deceased  . Father  Deceased  . Sister 1 Alive  . Brother 1 Alive  . PGM  Deceased  . PGF  Deceased  . Sister 2 Alive  . Brother 2 Alive        His family history includes Arthritis in his father and mother; Breast cancer in his paternal grandmother; Cataracts in his father; Dementia in his father; Heart attack in his paternal grandfather; Hyperlipidemia in his sister; Stroke in his mother.      Allergies  Allergen Reactions  . Naproxen     Other reaction(s): Other (See Comments) Burning sensation, break out in sores Other reaction(s): Other (See Comments) Burning sensation, break out in sores    Current Outpatient Medications:  .  acetaminophen (TYLENOL) 500 MG tablet, Take 1,000 mg by mouth every 6 (six) hours as needed. , Disp: , Rfl:  .  acetaminophen (TYLENOL) 650 MG CR tablet, Take 650 mg by mouth every 8 (eight) hours as needed for pain. , Disp: , Rfl:  .  aspirin 81 MG tablet, Take 81 mg by mouth daily., Disp: , Rfl:  .  B-Complex TABS, Take 1 tablet by mouth every morning., Disp: , Rfl:  .  Cholecalciferol (VITAMIN D3) 2000 UNITS TABS, Take 1 tablet by mouth every morning., Disp: , Rfl:  .  finasteride (PROSCAR) 5 MG tablet, Take 5 mg by mouth every morning., Disp: , Rfl:  .  Krill Oil 350 MG CAPS, Take 350 mg by mouth daily., Disp: , Rfl:  .   MAGNESIUM PO, Take 400 mg by mouth daily. , Disp: , Rfl:  .  Melatonin 1 MG TABS, Take 1 mg by mouth at bedtime. Takes 1/2 tablet, Disp: , Rfl:  .  Melatonin-Pyridoxine (MELATONEX PO), Take 1 tablet by mouth at bedtime. 0.5 , Disp: , Rfl:  .  Multiple Vitamins-Minerals (MULTIVITAMIN PO), Take 1 tablet by mouth every morning., Disp: , Rfl:  .  niacin 500 MG tablet, Take 500 mg by  mouth at bedtime., Disp: , Rfl:  .  tamsulosin (FLOMAX) 0.4 MG CAPS capsule, Take 1 capsule (0.4 mg total) by mouth daily after supper., Disp: 30 capsule, Rfl: 3 .  TURMERIC PO, Take 1 capsule by mouth every morning., Disp: , Rfl:    Patient Care Team: Shynia Daleo, Vickki Muff, PA as PCP - General (Physician Assistant) Eulogio Bear, MD as Consulting Physician (Ophthalmology) Tana Felts, MD as Referring Physician Carlis Abbott, Ferdinand Cava, PA as Consulting Physician (Physician Assistant) Brain Hilts, MD as Referring Physician (Gastroenterology)      Objective:   Vitals: BP 108/68 (BP Location: Right Arm, Patient Position: Sitting, Cuff Size: Normal)   Pulse 84   Temp 97.7 F (36.5 C) (Oral)   Ht 5\' 7"  (1.702 m)   Wt 187 lb 12.8 oz (85.2 kg)   SpO2 97%   BMI 29.41 kg/m  Wt Readings from Last 3 Encounters:  10/21/17 187 lb 12.8 oz (85.2 kg)  10/09/17 189 lb 6.4 oz (85.9 kg)  05/29/17 199 lb 3.2 oz (90.4 kg)   Physical Exam  Constitutional: He is oriented to person, place, and time. He appears well-developed and well-nourished.  HENT:  Head: Normocephalic and atraumatic.  Right Ear: External ear normal.  Left Ear: External ear normal.  Nose: Nose normal.  Mouth/Throat: Oropharynx is clear and moist.  Eyes: Pupils are equal, round, and reactive to light. Conjunctivae and EOM are normal. Right eye exhibits no discharge.  Neck: Normal range of motion. Neck supple. No tracheal deviation present. No thyromegaly present.  Cardiovascular: Normal rate, regular rhythm, normal heart sounds and intact  distal pulses.  No murmur heard. Pulmonary/Chest: Effort normal and breath sounds normal. No respiratory distress. He has no wheezes. He has no rales. He exhibits no tenderness.  Abdominal: Soft. He exhibits no distension and no mass. There is no tenderness. There is no rebound and no guarding.  Genitourinary:  Genitourinary Comments: Deferred to urologist.  Musculoskeletal: Normal range of motion. He exhibits no edema or tenderness.  Lymphadenopathy:    He has no cervical adenopathy.  Neurological: He is alert and oriented to person, place, and time. He has normal reflexes. He displays normal reflexes. No cranial nerve deficit. He exhibits normal muscle tone. Coordination normal.  Skin: Skin is warm and dry. No rash noted. No erythema.  Psychiatric: He has a normal mood and affect. His behavior is normal. Judgment and thought content normal.    Depression Screen PHQ 2/9 Scores 10/09/2017 10/08/2016 10/08/2016  PHQ - 2 Score 0 0 0  PHQ- 9 Score - 4 -   Assessment & Plan:     Routine Health Maintenance and Physical Exam  Exercise Activities and Dietary recommendations Goals    . DIET - INCREASE WATER INTAKE     Recommend increasing water intake to 4-6 glasses a day.       Immunization History  Administered Date(s) Administered  . Influenza, High Dose Seasonal PF 06/28/2014, 05/29/2017  . Influenza,inj,Quad PF,6+ Mos 04/27/2013, 03/28/2016  . Influenza-Unspecified 06/08/2015  . Pneumococcal Conjugate-13 06/28/2014  . Pneumococcal Polysaccharide-23 05/06/2016  . Td 09/06/2009    Health Maintenance  Topic Date Due  . INFLUENZA VACCINE  01/08/2018  . TETANUS/TDAP  09/07/2019  . COLONOSCOPY  01/31/2027  . Hepatitis C Screening  Completed  . PNA vac Low Risk Adult  Completed    Discussed health benefits of physical activity, and encouraged him to engage in regular exercise appropriate for his age and condition.      --------------------------------------------------------------------  1. Annual physical exam General health stable. No significant concerns or complaints today. Immunizations and colonoscopy up to date (01-30-17 by Dr. Brandon Melnick at [facility] with a couple benign polyps removed and advised to repeat test in 10 years). Given anticipatory counseling and will get routine follow up labs. Recheck pending reports. To get PSA at urology appointment tomorrow. - CBC with Differential/Platelet - Comprehensive metabolic panel - Lipid panel - TSH  2. Elevated prostate specific antigen (PSA) Last PSA- 10/23/16 at the [facility] was 9.28 (highest reading was 18.4 on 06/30/14). MRI showed 108 gm prostate - BPH - 09-23-14. Has follow up appointment with Dr. Thomos Lemons (urology oncologist) 10-22-17.  3. Hypercholesteremia Continues to try to follow a low fat diet and regular exercise. Tolerating the Niacin 500 mg hs and Krill Oil 350 mg qd. Recheck CMP, TSH and Lipid Panel. Recheck pending reports. - Comprehensive metabolic panel - Lipid panel - TSH    Vernie Murders, PA  [facility]

## 2017-10-22 DIAGNOSIS — R972 Elevated prostate specific antigen [PSA]: Secondary | ICD-10-CM | POA: Diagnosis not present

## 2017-10-27 DIAGNOSIS — M25551 Pain in right hip: Secondary | ICD-10-CM | POA: Diagnosis not present

## 2017-10-27 DIAGNOSIS — M25552 Pain in left hip: Secondary | ICD-10-CM | POA: Diagnosis not present

## 2017-10-28 DIAGNOSIS — M160 Bilateral primary osteoarthritis of hip: Secondary | ICD-10-CM | POA: Diagnosis not present

## 2017-10-30 DIAGNOSIS — E7800 Pure hypercholesterolemia, unspecified: Secondary | ICD-10-CM | POA: Diagnosis not present

## 2017-10-30 DIAGNOSIS — Z0000 Encounter for general adult medical examination without abnormal findings: Secondary | ICD-10-CM | POA: Diagnosis not present

## 2017-10-31 LAB — CBC WITH DIFFERENTIAL/PLATELET
BASOS ABS: 0 10*3/uL (ref 0.0–0.2)
Basos: 0 %
EOS (ABSOLUTE): 0.6 10*3/uL — AB (ref 0.0–0.4)
Eos: 7 %
Hematocrit: 51.4 % — ABNORMAL HIGH (ref 37.5–51.0)
Hemoglobin: 18.1 g/dL — ABNORMAL HIGH (ref 13.0–17.7)
IMMATURE GRANS (ABS): 0 10*3/uL (ref 0.0–0.1)
IMMATURE GRANULOCYTES: 0 %
Lymphocytes Absolute: 1.9 10*3/uL (ref 0.7–3.1)
Lymphs: 23 %
MCH: 31.9 pg (ref 26.6–33.0)
MCHC: 35.2 g/dL (ref 31.5–35.7)
MCV: 91 fL (ref 79–97)
Monocytes Absolute: 1 10*3/uL — ABNORMAL HIGH (ref 0.1–0.9)
Monocytes: 13 %
NEUTROS PCT: 57 %
Neutrophils Absolute: 4.8 10*3/uL (ref 1.4–7.0)
PLATELETS: 224 10*3/uL (ref 150–450)
RBC: 5.67 x10E6/uL (ref 4.14–5.80)
RDW: 13.8 % (ref 12.3–15.4)
WBC: 8.3 10*3/uL (ref 3.4–10.8)

## 2017-10-31 LAB — COMPREHENSIVE METABOLIC PANEL
ALT: 14 IU/L (ref 0–44)
AST: 19 IU/L (ref 0–40)
Albumin/Globulin Ratio: 2.9 — ABNORMAL HIGH (ref 1.2–2.2)
Albumin: 4.6 g/dL (ref 3.6–4.8)
Alkaline Phosphatase: 88 IU/L (ref 39–117)
BUN/Creatinine Ratio: 16 (ref 10–24)
BUN: 20 mg/dL (ref 8–27)
Bilirubin Total: 0.4 mg/dL (ref 0.0–1.2)
CO2: 20 mmol/L (ref 20–29)
Calcium: 9.3 mg/dL (ref 8.6–10.2)
Chloride: 106 mmol/L (ref 96–106)
Creatinine, Ser: 1.27 mg/dL (ref 0.76–1.27)
GFR calc non Af Amer: 58 mL/min/{1.73_m2} — ABNORMAL LOW (ref >59)
GFR, EST AFRICAN AMERICAN: 67 mL/min/{1.73_m2} (ref >59)
GLUCOSE: 113 mg/dL — AB (ref 65–99)
Globulin, Total: 1.6 g/dL (ref 1.5–4.5)
Potassium: 4.4 mmol/L (ref 3.5–5.2)
Sodium: 142 mmol/L (ref 134–144)
TOTAL PROTEIN: 6.2 g/dL (ref 6.0–8.5)

## 2017-10-31 LAB — LIPID PANEL
CHOLESTEROL TOTAL: 239 mg/dL — AB (ref 100–199)
Chol/HDL Ratio: 4.1 ratio (ref 0.0–5.0)
HDL: 58 mg/dL (ref >39)
LDL Calculated: 165 mg/dL — ABNORMAL HIGH (ref 0–99)
Triglycerides: 82 mg/dL (ref 0–149)
VLDL CHOLESTEROL CAL: 16 mg/dL (ref 5–40)

## 2017-10-31 LAB — TSH: TSH: 4.86 u[IU]/mL — ABNORMAL HIGH (ref 0.450–4.500)

## 2018-03-17 DIAGNOSIS — Z23 Encounter for immunization: Secondary | ICD-10-CM | POA: Diagnosis not present

## 2018-05-15 ENCOUNTER — Ambulatory Visit (INDEPENDENT_AMBULATORY_CARE_PROVIDER_SITE_OTHER): Payer: Medicare Other | Admitting: Family Medicine

## 2018-05-15 ENCOUNTER — Encounter: Payer: Self-pay | Admitting: Family Medicine

## 2018-05-15 VITALS — BP 126/78 | HR 67 | Temp 97.6°F | Resp 16 | Wt 196.0 lb

## 2018-05-15 DIAGNOSIS — R42 Dizziness and giddiness: Secondary | ICD-10-CM

## 2018-05-15 NOTE — Progress Notes (Signed)
Patient: Jacob Patterson Male    DOB: 07/11/1950   67 y.o.   MRN: [id number] Visit Date: 05/15/2018  Today's Provider: Vernie Murders, PA   No chief complaint on file.  Subjective:    Dizziness  This is a new problem. The current episode started today. The problem occurs intermittently. The problem has been unchanged. Associated symptoms include congestion, diaphoresis, vertigo and a visual change. Pertinent negatives include no abdominal pain, anorexia, arthralgias, change in bowel habit, chest pain, chills, coughing, fatigue, fever, headaches, joint swelling, myalgias, nausea, neck pain, numbness, rash, sore throat, swollen glands, urinary symptoms, vomiting or weakness. The symptoms are aggravated by twisting, bending and walking.    Patient states he woke in the middle of the night with his head feeling strange and he was very dizzy. Patient states his vision was also blurry. Patient states when woke up this morning he was still having dizziness. Patient states that dizziness does have a vertigo sensation, with the room spinning. Dizziness occurs intermittently, when moving his head a certain way or when walking. Patient also stated that his stomach was off during dizzy episodes.  Past Medical History:  Diagnosis Date  . Arthritis   . Diverticulosis   . Dizziness    medication related  . Dizziness   . Environmental allergies   . GERD (gastroesophageal reflux disease)   . Hiatal hernia   . HOH (hard of hearing)   . Hypercholesteremia   . Kidney stones   . Murmur   . PE (pulmonary embolism)   . Wheezing    Past Surgical History:  Procedure Laterality Date  . CATARACT EXTRACTION W/PHACO Left 11/23/2015   Procedure: CATARACT EXTRACTION PHACO AND INTRAOCULAR LENS PLACEMENT (IOC);  Surgeon: Eulogio Bear, MD;  Location: [facility] ORS;  Service: Ophthalmology;  Laterality: Left;  Lot # [id number] H [place]: 01:03.8 AP%: 19.5 CDE: 12.46   . CATARACT EXTRACTION W/PHACO Right 02/29/2016    Procedure: CATARACT EXTRACTION PHACO AND INTRAOCULAR LENS PLACEMENT (IOC);  Surgeon: Eulogio Bear, MD;  Location: [facility] ORS;  Service: Ophthalmology;  Laterality: Right;  [place] 26.4 AP% 14.9 CDE 3.93 Fluid Pack Lot # [id number]  . COLONOSCOPY  2008  . kidney stent    . TONSILLECTOMY    . TONSILLECTOMY AND ADENOIDECTOMY  1960   Family History  Problem Relation Age of Onset  . Arthritis Mother   . Stroke Mother   . Arthritis Father   . Dementia Father   . Cataracts Father   . Hyperlipidemia Sister   . Breast cancer Paternal Grandmother   . Heart attack Paternal Grandfather    Allergies  Allergen Reactions  . Naproxen     Other reaction(s): Other (See Comments) Burning sensation, break out in sores Other reaction(s): Other (See Comments) Burning sensation, break out in sores    Current Outpatient Medications:  .  acetaminophen (TYLENOL) 500 MG tablet, Take 1,000 mg by mouth every 6 (six) hours as needed. , Disp: , Rfl:  .  acetaminophen (TYLENOL) 650 MG CR tablet, Take 650 mg by mouth every 8 (eight) hours as needed for pain. , Disp: , Rfl:  .  aspirin 81 MG tablet, Take 81 mg by mouth daily., Disp: , Rfl:  .  B-Complex TABS, Take 1 tablet by mouth every morning., Disp: , Rfl:  .  Cholecalciferol (VITAMIN D3) 2000 UNITS TABS, Take 1 tablet by mouth every morning., Disp: , Rfl:  .  finasteride (PROSCAR) 5 MG tablet, Take  5 mg by mouth every morning., Disp: , Rfl:  .  Krill Oil 350 MG CAPS, Take 350 mg by mouth daily., Disp: , Rfl:  .  MAGNESIUM PO, Take 400 mg by mouth daily. , Disp: , Rfl:  .  Melatonin 1 MG TABS, Take 1 mg by mouth at bedtime. Takes 1/2 tablet, Disp: , Rfl:  .  Melatonin-Pyridoxine (MELATONEX PO), Take 1 tablet by mouth at bedtime. 0.5 , Disp: , Rfl:  .  Multiple Vitamins-Minerals (MULTIVITAMIN PO), Take 1 tablet by mouth every morning., Disp: , Rfl:  .  niacin 500 MG tablet, Take 500 mg by mouth at bedtime., Disp: , Rfl:  .  tamsulosin (FLOMAX) 0.4 MG CAPS  capsule, Take 1 capsule (0.4 mg total) by mouth daily after supper., Disp: 30 capsule, Rfl: 3 .  TURMERIC PO, Take 1 capsule by mouth every morning., Disp: , Rfl:   Review of Systems  Constitutional: Positive for diaphoresis. Negative for appetite change, chills, fatigue and fever.  HENT: Positive for congestion and sinus pain. Negative for ear discharge, ear pain, sinus pressure and sore throat.   Respiratory: Negative for cough, chest tightness, shortness of breath and wheezing.   Cardiovascular: Negative for chest pain and palpitations.  Gastrointestinal: Negative for abdominal pain, anorexia, change in bowel habit, nausea and vomiting.  Musculoskeletal: Negative for arthralgias, joint swelling, myalgias and neck pain.  Skin: Negative for rash.  Neurological: Positive for dizziness and vertigo. Negative for weakness, numbness and headaches.   Social History   Tobacco Use  . Smoking status: Former Smoker    Types: Cigarettes  . Smokeless tobacco: Never Used  . Tobacco comment: Quit in 1989  Substance Use Topics  . Alcohol use: Yes    Alcohol/week: 3.0 - 4.0 standard drinks    Types: 3 - 4 Glasses of wine per week   Objective:   BP 126/78 (BP Location: Right Arm, Patient Position: Sitting, Cuff Size: Large)   Pulse 67   Temp 97.6 F (36.4 C) (Oral)   Resp 16   Wt 196 lb (88.9 kg)   SpO2 99%   BMI 30.70 kg/m  Vitals:   05/15/18 0942  BP: 126/78  Pulse: 67  Resp: 16  Temp: 97.6 F (36.4 C)  TempSrc: Oral  SpO2: 99%  Weight: 196 lb (88.9 kg)   Physical Exam  Constitutional: He is oriented to person, place, and time. He appears well-developed and well-nourished. No distress.  HENT:  Head: Normocephalic and atraumatic.  Right Ear: Hearing normal.  Left Ear: Hearing normal.  Nose: Nose normal.  Eyes: Conjunctivae and lids are normal. Right eye exhibits no discharge. Left eye exhibits no discharge. No scleral icterus.  Cardiovascular: Normal rate, regular rhythm and  normal heart sounds.  Pulmonary/Chest: Effort normal and breath sounds normal. No respiratory distress.  Abdominal: Soft. Bowel sounds are normal.  Musculoskeletal: Normal range of motion.  Neurological: He is alert and oriented to person, place, and time. He displays normal reflexes. No cranial nerve deficit or sensory deficit.  No nystagmus with Dix-Hallpike. Slightly off balance when he first stands.  Skin: Skin is intact. No lesion and no rash noted.  Psychiatric: He has a normal mood and affect. His speech is normal and behavior is normal. Thought content normal.   Depression screen [facility] 2/9 10/09/2017 10/08/2016 10/08/2016  Decreased Interest 0 0 0  Down, Depressed, Hopeless 0 0 0  PHQ - 2 Score 0 0 0  Altered sleeping - 1 -  Tired,  decreased energy - 1 -  Change in appetite - 1 -  Feeling bad or failure about yourself  - 0 -  Trouble concentrating - 0 -  Moving slowly or fidgety/restless - 0 -  Suicidal thoughts - 1 -  PHQ-9 Score - 4 -       Assessment & Plan:     1. Dizziness Onset this morning while in bed. Lightheaded sensation fades after he stands for a few seconds. No nausea or vomiting. No new medications or additions. Slightly better after drinking some ginger tea this morning. No weakness, dysphagia or speech changes. May use Meclizine prn and will get CBC with CMP to rule out anemia, viral illness or metabolic imbalances. Recheck pending reports. - CBC with Differential/Platelet - Comprehensive metabolic panel       Vernie Murders, PA  [facility]

## 2018-05-16 LAB — COMPREHENSIVE METABOLIC PANEL
A/G RATIO: 2 (ref 1.2–2.2)
ALT: 20 IU/L (ref 0–44)
AST: 25 IU/L (ref 0–40)
Albumin: 4.4 g/dL (ref 3.6–4.8)
Alkaline Phosphatase: 80 IU/L (ref 39–117)
BUN/Creatinine Ratio: 14 (ref 10–24)
BUN: 18 mg/dL (ref 8–27)
Bilirubin Total: 0.4 mg/dL (ref 0.0–1.2)
CALCIUM: 9.8 mg/dL (ref 8.6–10.2)
CO2: 24 mmol/L (ref 20–29)
CREATININE: 1.28 mg/dL — AB (ref 0.76–1.27)
Chloride: 101 mmol/L (ref 96–106)
GFR, EST AFRICAN AMERICAN: 66 mL/min/{1.73_m2} (ref >59)
GFR, EST NON AFRICAN AMERICAN: 57 mL/min/{1.73_m2} — AB (ref >59)
GLUCOSE: 112 mg/dL — AB (ref 65–99)
Globulin, Total: 2.2 g/dL (ref 1.5–4.5)
Potassium: 4.4 mmol/L (ref 3.5–5.2)
Sodium: 143 mmol/L (ref 134–144)
TOTAL PROTEIN: 6.6 g/dL (ref 6.0–8.5)

## 2018-05-16 LAB — CBC WITH DIFFERENTIAL/PLATELET
BASOS: 1 %
Basophils Absolute: 0.1 10*3/uL (ref 0.0–0.2)
EOS (ABSOLUTE): 0.1 10*3/uL (ref 0.0–0.4)
Eos: 1 %
Hematocrit: 50.3 % (ref 37.5–51.0)
Hemoglobin: 17 g/dL (ref 13.0–17.7)
IMMATURE GRANS (ABS): 0 10*3/uL (ref 0.0–0.1)
Immature Granulocytes: 1 %
LYMPHS: 17 %
Lymphocytes Absolute: 1.5 10*3/uL (ref 0.7–3.1)
MCH: 30.3 pg (ref 26.6–33.0)
MCHC: 33.8 g/dL (ref 31.5–35.7)
MCV: 90 fL (ref 79–97)
Monocytes Absolute: 0.9 10*3/uL (ref 0.1–0.9)
Monocytes: 10 %
Neutrophils Absolute: 6 10*3/uL (ref 1.4–7.0)
Neutrophils: 70 %
PLATELETS: 267 10*3/uL (ref 150–450)
RBC: 5.61 x10E6/uL (ref 4.14–5.80)
RDW: 12.9 % (ref 12.3–15.4)
WBC: 8.5 10*3/uL (ref 3.4–10.8)

## 2018-05-18 ENCOUNTER — Telehealth: Payer: Self-pay | Admitting: *Deleted

## 2018-05-18 NOTE — Telephone Encounter (Signed)
Magnesium deficiency can cause kidney dysfunction. A supplement does not cause kidney disease.

## 2018-05-18 NOTE — Telephone Encounter (Signed)
LMOVM for pt to return call 

## 2018-05-18 NOTE — Telephone Encounter (Signed)
Patient wanted to know since he is taking magnesium regularly. Would that cause his kidney functions to decline or have any effect on his kidneys? Please advise?

## 2018-05-20 NOTE — Telephone Encounter (Signed)
Patient advised.

## 2018-05-20 NOTE — Telephone Encounter (Signed)
LMOVM for pt to return call 

## 2018-06-13 ENCOUNTER — Ambulatory Visit (INDEPENDENT_AMBULATORY_CARE_PROVIDER_SITE_OTHER): Payer: Medicare Other | Admitting: Family Medicine

## 2018-06-13 ENCOUNTER — Encounter: Payer: Self-pay | Admitting: Family Medicine

## 2018-06-13 VITALS — BP 130/85 | HR 88 | Temp 98.6°F | Wt 197.4 lb

## 2018-06-13 DIAGNOSIS — R05 Cough: Secondary | ICD-10-CM

## 2018-06-13 DIAGNOSIS — R059 Cough, unspecified: Secondary | ICD-10-CM

## 2018-06-13 DIAGNOSIS — J069 Acute upper respiratory infection, unspecified: Secondary | ICD-10-CM

## 2018-06-13 MED ORDER — AZITHROMYCIN 250 MG PO TABS: ORAL_TABLET | ORAL | 0 refills | Status: AC

## 2018-06-13 NOTE — Patient Instructions (Signed)
.   Please bring all of your medications to every appointment so we can make sure that our medication list is the same as yours.   

## 2018-06-13 NOTE — Progress Notes (Signed)
Patient: Jacob Patterson Male    DOB: 12-06-1950   68 y.o.   MRN: [id number] Visit Date: 06/13/2018  Today's Provider: Lelon Huh, MD   Chief Complaint  Patient presents with  . Cough   Subjective:     Cough  The current episode started 1 to 4 weeks ago. The problem has been gradually worsening. The problem occurs constantly. The cough is productive of sputum. Associated symptoms include nasal congestion, postnasal drip, shortness of breath and wheezing. The symptoms are aggravated by lying down.    Allergies  Allergen Reactions  . Naproxen     Other reaction(s): Other (See Comments) Burning sensation, break out in sores Other reaction(s): Other (See Comments) Burning sensation, break out in sores     Current Outpatient Medications:  .  acetaminophen (TYLENOL) 500 MG tablet, Take 1,000 mg by mouth every 6 (six) hours as needed. , Disp: , Rfl:  .  acetaminophen (TYLENOL) 650 MG CR tablet, Take 650 mg by mouth every 8 (eight) hours as needed for pain. , Disp: , Rfl:  .  aspirin 81 MG tablet, Take 81 mg by mouth daily., Disp: , Rfl:  .  B-Complex TABS, Take 1 tablet by mouth every morning., Disp: , Rfl:  .  Cholecalciferol (VITAMIN D3) 2000 UNITS TABS, Take 1 tablet by mouth every morning., Disp: , Rfl:  .  finasteride (PROSCAR) 5 MG tablet, Take 5 mg by mouth every morning., Disp: , Rfl:  .  Krill Oil 350 MG CAPS, Take 350 mg by mouth daily., Disp: , Rfl:  .  MAGNESIUM PO, Take 400 mg by mouth daily. , Disp: , Rfl:  .  Melatonin 1 MG TABS, Take 1 mg by mouth at bedtime. Takes 1/2 tablet, Disp: , Rfl:  .  Melatonin-Pyridoxine (MELATONEX PO), Take 1 tablet by mouth at bedtime. 0.5 , Disp: , Rfl:  .  Multiple Vitamins-Minerals (MULTIVITAMIN PO), Take 1 tablet by mouth every morning., Disp: , Rfl:  .  niacin 500 MG tablet, Take 500 mg by mouth at bedtime., Disp: , Rfl:  .  tamsulosin (FLOMAX) 0.4 MG CAPS capsule, Take 1 capsule (0.4 mg total) by mouth daily after supper.,  Disp: 30 capsule, Rfl: 3 .  TURMERIC PO, Take 1 capsule by mouth every morning., Disp: , Rfl:   Review of Systems  HENT: Positive for postnasal drip.   Respiratory: Positive for cough, shortness of breath and wheezing.     Social History   Tobacco Use  . Smoking status: Former Smoker    Types: Cigarettes  . Smokeless tobacco: Never Used  . Tobacco comment: Quit in 1989  Substance Use Topics  . Alcohol use: Yes    Alcohol/week: 3.0 - 4.0 standard drinks    Types: 3 - 4 Glasses of wine per week      Objective:   BP 130/85 (BP Location: Left Arm, Patient Position: Sitting, Cuff Size: Normal)   Pulse 88   Temp 98.6 F (37 C) (Oral)   Wt 197 lb 6.4 oz (89.5 kg)   SpO2 95%   BMI 30.92 kg/m  Vitals:   06/13/18 0906  BP: 130/85  Pulse: 88  Temp: 98.6 F (37 C)  TempSrc: Oral  SpO2: 95%  Weight: 197 lb 6.4 oz (89.5 kg)     Physical Exam  General Appearance:    Alert, cooperative, no distress  HENT:   bilateral TM normal without fluid or infection, sinuses nontender and nasal mucosa  congested  Eyes:    PERRL, conjunctiva/corneas clear, EOM's intact       Lungs:     Occasional expiratory wheeze, no rales,, respirations unlabored  Heart:    Regular rate and rhythm  Neurologic:   Awake, alert, oriented x 3. No apparent focal neurological           defect.          Assessment & Plan    1. Cough   2. Upper respiratory tract infection, unspecified type Consider sx have persisted 2 weeks without improvement will start azithromycin.      Lelon Huh, MD  [facility]

## 2018-07-27 ENCOUNTER — Encounter: Payer: Self-pay | Admitting: Family Medicine

## 2018-07-27 ENCOUNTER — Ambulatory Visit
Admission: RE | Admit: 2018-07-27 | Discharge: 2018-07-27 | Disposition: A | Discharge status: Home / Self Care | Payer: Medicare Other | Source: Ambulatory Visit | Attending: Family Medicine | Admitting: Family Medicine

## 2018-07-27 ENCOUNTER — Ambulatory Visit
Admission: RE | Admit: 2018-07-27 | Discharge: 2018-07-27 | Disposition: A | Discharge status: Home / Self Care | Payer: Medicare Other | Attending: Family Medicine | Admitting: Family Medicine

## 2018-07-27 ENCOUNTER — Ambulatory Visit (INDEPENDENT_AMBULATORY_CARE_PROVIDER_SITE_OTHER): Payer: Medicare Other | Admitting: Family Medicine

## 2018-07-27 VITALS — BP 102/60 | HR 83 | Temp 97.5°F | Resp 16 | Wt 196.0 lb

## 2018-07-27 DIAGNOSIS — J40 Bronchitis, not specified as acute or chronic: Secondary | ICD-10-CM | POA: Diagnosis not present

## 2018-07-27 DIAGNOSIS — R053 Chronic cough: Secondary | ICD-10-CM

## 2018-07-27 DIAGNOSIS — R05 Cough: Secondary | ICD-10-CM | POA: Diagnosis not present

## 2018-07-27 MED ORDER — GUAIFENESIN-CODEINE 100-10 MG/5ML PO SYRP: 5.0000 mL | ORAL_SOLUTION | Freq: Three times a day (TID) | ORAL | 0 refills | Status: DC | PRN

## 2018-07-27 NOTE — Progress Notes (Signed)
Patient: Jacob Patterson Male    DOB: Feb 01, 1951   68 y.o.   MRN: [id number] Visit Date: 07/27/2018  Today's Provider: Vernie Murders, PA   Chief Complaint  Patient presents with  . Cough   Subjective:     HPI   Upper respiratory tract infection, unspecified type From 06/13/2018-seen by Dr. Caryn Section. Given rx for azithromycin.   Patient states cough and congestion has not improved. Patient has been using cough drops and home remedies with no relief. Cough is productive.   Past Medical History:  Diagnosis Date  . Arthritis   . Diverticulosis   . Dizziness    medication related  . Dizziness   . Environmental allergies   . GERD (gastroesophageal reflux disease)   . Hiatal hernia   . HOH (hard of hearing)   . Hypercholesteremia   . Kidney stones   . Murmur   . PE (pulmonary embolism)   . Wheezing    Past Surgical History:  Procedure Laterality Date  . CATARACT EXTRACTION W/PHACO Left 11/23/2015   Procedure: CATARACT EXTRACTION PHACO AND INTRAOCULAR LENS PLACEMENT (IOC);  Surgeon: Eulogio Bear, MD;  Location: [facility] ORS;  Service: Ophthalmology;  Laterality: Left;  Lot # [id number] H [place]: 01:03.8 AP%: 19.5 CDE: 12.46   . CATARACT EXTRACTION W/PHACO Right 02/29/2016   Procedure: CATARACT EXTRACTION PHACO AND INTRAOCULAR LENS PLACEMENT (IOC);  Surgeon: Eulogio Bear, MD;  Location: [facility] ORS;  Service: Ophthalmology;  Laterality: Right;  [place] 26.4 AP% 14.9 CDE 3.93 Fluid Pack Lot # [id number]  . COLONOSCOPY  2008  . kidney stent    . TONSILLECTOMY    . TONSILLECTOMY AND ADENOIDECTOMY  1960   Family History  Problem Relation Age of Onset  . Arthritis Mother   . Stroke Mother   . Arthritis Father   . Dementia Father   . Cataracts Father   . Hyperlipidemia Sister   . Breast cancer Paternal Grandmother   . Heart attack Paternal Grandfather    Allergies  Allergen Reactions  . Naproxen     Other reaction(s): Other (See Comments) Burning sensation, break out  in sores Other reaction(s): Other (See Comments) Burning sensation, break out in sores    Current Outpatient Medications:  .  aspirin 81 MG tablet, Take 81 mg by mouth daily., Disp: , Rfl:  .  B-Complex TABS, Take 1 tablet by mouth every morning., Disp: , Rfl:  .  Cholecalciferol (VITAMIN D3) 2000 UNITS TABS, Take 1 tablet by mouth every morning., Disp: , Rfl:  .  finasteride (PROSCAR) 5 MG tablet, Take 5 mg by mouth every morning., Disp: , Rfl:  .  MAGNESIUM PO, Take 400 mg by mouth daily. , Disp: , Rfl:  .  Melatonin 1 MG TABS, Take 1 mg by mouth at bedtime. Takes 1/2 tablet, Disp: , Rfl:  .  Multiple Vitamins-Minerals (MULTIVITAMIN PO), Take 1 tablet by mouth every morning., Disp: , Rfl:  .  Naproxen Sodium (ALEVE PO), Take by mouth as needed., Disp: , Rfl:  .  niacin 500 MG tablet, Take 500 mg by mouth at bedtime., Disp: , Rfl:  .  tamsulosin (FLOMAX) 0.4 MG CAPS capsule, Take 1 capsule (0.4 mg total) by mouth daily after supper., Disp: 30 capsule, Rfl: 3 .  acetaminophen (TYLENOL) 500 MG tablet, Take 1,000 mg by mouth every 6 (six) hours as needed. , Disp: , Rfl:  .  acetaminophen (TYLENOL) 650 MG CR tablet, Take 650 mg by mouth  every 8 (eight) hours as needed for pain. , Disp: , Rfl:  .  Krill Oil 350 MG CAPS, Take 350 mg by mouth daily., Disp: , Rfl:  .  Melatonin-Pyridoxine (MELATONEX PO), Take 1 tablet by mouth at bedtime. 0.5 , Disp: , Rfl:  .  TURMERIC PO, Take 1 capsule by mouth every morning., Disp: , Rfl:   Review of Systems  Constitutional: Negative for appetite change and fever.  Respiratory: Negative for chest tightness and wheezing.   Cardiovascular: Negative for chest pain and palpitations.  Gastrointestinal: Negative for abdominal pain, nausea and vomiting.   Social History   Tobacco Use  . Smoking status: Former Smoker    Types: Cigarettes  . Smokeless tobacco: Never Used  . Tobacco comment: Quit in 1989  Substance Use Topics  . Alcohol use: Yes     Alcohol/week: 3.0 - 4.0 standard drinks    Types: 3 - 4 Glasses of wine per week     Objective:   BP 102/60 (BP Location: Right Arm, Patient Position: Sitting, Cuff Size: Large)   Pulse 83   Temp (!) 97.5 F (36.4 C) (Oral)   Resp 16   Wt 196 lb (88.9 kg)   SpO2 94%   BMI 30.70 kg/m  Vitals:   07/27/18 1432  BP: 102/60  Pulse: 83  Resp: 16  Temp: (!) 97.5 F (36.4 C)  TempSrc: Oral  SpO2: 94%  Weight: 196 lb (88.9 kg)   Physical Exam Constitutional:      General: He is not in acute distress.    Appearance: He is well-developed.  HENT:     Head: Normocephalic and atraumatic.     Right Ear: Hearing and tympanic membrane normal.     Left Ear: Hearing and tympanic membrane normal.     Nose: Nose normal.     Mouth/Throat:     Pharynx: Oropharynx is clear.     Comments: Slight redness to uvula without pharyngeal exudates. Eyes:     General: Lids are normal. No scleral icterus.       Right eye: No discharge.        Left eye: No discharge.     Conjunctiva/sclera: Conjunctivae normal.  Neck:     Musculoskeletal: Neck supple.  Cardiovascular:     Rate and Rhythm: Normal rate and regular rhythm.     Heart sounds: Normal heart sounds.  Pulmonary:     Effort: Pulmonary effort is normal. No respiratory distress.     Breath sounds: Normal breath sounds. No wheezing, rhonchi or rales.  Abdominal:     General: Bowel sounds are normal.     Palpations: Abdomen is soft.  Musculoskeletal: Normal range of motion.  Skin:    Findings: No lesion or rash.  Neurological:     Mental Status: He is alert and oriented to person, place, and time.  Psychiatric:        Speech: Speech normal.        Behavior: Behavior normal.        Thought Content: Thought content normal.       Assessment & Plan    1. Persistent cough for 3 weeks or longer Hacking cough with clear sputum occasionally now. No fever, sore throat, ear ache, chest pains or PND. Hot teas and OTC cough/cold meds not much  help. Will give Guaifenesin/Codeine cough syrup and check CBC with CXR to rule out pneumonia. Recheck pending reports. - CBC with Differential/Platelet - DG Chest 2 View - guaiFENesin-codeine (  ROBITUSSIN AC) 100-10 MG/5ML syrup; Take 5 mLs by mouth 3 (three) times daily as needed for cough.  Dispense: 120 mL; Refill: 0  2. Bronchitis Developed a cough during Christmas and treated with Azithromycin on 06-13-18. Cough persists without fever or PND. Hot teas and OTC cough syrups not much help controlling cough. Will give Robitussin-AC, get CBC with diff and CXR to rule out pneumonia. May need prednisone taper to clear inflammation. Recheck pending reports. - CBC with Differential/Platelet - DG Chest [place], [facility]

## 2018-07-28 ENCOUNTER — Telehealth: Payer: Self-pay | Admitting: *Deleted

## 2018-07-28 LAB — CBC WITH DIFFERENTIAL/PLATELET
BASOS: 1 %
Basophils Absolute: 0.1 10*3/uL (ref 0.0–0.2)
EOS (ABSOLUTE): 0.7 10*3/uL — ABNORMAL HIGH (ref 0.0–0.4)
EOS: 7 %
HEMATOCRIT: 49.7 % (ref 37.5–51.0)
Hemoglobin: 16.8 g/dL (ref 13.0–17.7)
Immature Grans (Abs): 0.1 10*3/uL (ref 0.0–0.1)
Immature Granulocytes: 1 %
Lymphocytes Absolute: 1.7 10*3/uL (ref 0.7–3.1)
Lymphs: 19 %
MCH: 29.8 pg (ref 26.6–33.0)
MCHC: 33.8 g/dL (ref 31.5–35.7)
MCV: 88 fL (ref 79–97)
MONOCYTES: 14 %
Monocytes Absolute: 1.2 10*3/uL — ABNORMAL HIGH (ref 0.1–0.9)
NEUTROS PCT: 58 %
Neutrophils Absolute: 5.3 10*3/uL (ref 1.4–7.0)
Platelets: 265 10*3/uL (ref 150–450)
RBC: 5.64 x10E6/uL (ref 4.14–5.80)
RDW: 12.7 % (ref 11.6–15.4)
WBC: 9 10*3/uL (ref 3.4–10.8)

## 2018-07-28 MED ORDER — LEVOFLOXACIN 500 MG PO TABS: 500.0000 mg | ORAL_TABLET | Freq: Every day | ORAL | 0 refills | Status: DC

## 2018-07-28 NOTE — Telephone Encounter (Signed)
Patient was notified of results. Expressed understanding. Rx sent to pharmacy. 

## 2018-07-28 NOTE — Telephone Encounter (Signed)
-----   Message from Margo Common, [place] sent at 07/28/2018  9:15 AM EST ----- No elevation of WBC count or anemia on blood test. CXR raises the question of a little infiltrate or a nodule in the right lung. Would treat with Levaquin 500 mg qd #7 and recheck in 7-10 days to see if a CT scan is needed.

## 2018-10-13 ENCOUNTER — Ambulatory Visit: Payer: Self-pay

## 2018-10-14 ENCOUNTER — Other Ambulatory Visit: Payer: Self-pay

## 2018-10-14 ENCOUNTER — Ambulatory Visit (INDEPENDENT_AMBULATORY_CARE_PROVIDER_SITE_OTHER): Payer: Medicare Other

## 2018-10-14 DIAGNOSIS — Z0000 Encounter for general adult medical examination without abnormal findings: Secondary | ICD-10-CM | POA: Diagnosis not present

## 2018-10-14 NOTE — Progress Notes (Addendum)
Subjective:   Jacob Patterson is a 68 y.o. male who presents for Medicare Annual/Subsequent preventive examination.    This visit is being conducted through telemedicine due to the COVID-19 pandemic. This patient has given me verbal consent via doximity to conduct this visit, patient states they are participating from their home address. Some vital signs may be absent or patient reported.    Patient identification: identified by name, DOB, and current address  Review of Systems:  N/A  Cardiac Risk Factors include: advanced age (>16men, >96 women);male gender;obesity (BMI >30kg/m2)     Objective:    Vitals: There were no vitals taken for this visit.  There is no height or weight on file to calculate BMI. Unable to obtain vitals due to visit being conducted via telephonically.   Advanced Directives 10/14/2018 10/09/2017 10/08/2016 03/24/2016 02/29/2016 11/23/2015 12/06/2014  Does Patient Have a Medical Advance Directive? No No No No No No No  Would patient like information on creating a medical advance directive? No - Patient declined No - Patient declined Yes (ED - Information included in AVS) - No - patient declined information - -    Tobacco Social History   Tobacco Use  Smoking Status Former Smoker   Types: Cigarettes  Smokeless Tobacco Never Used  Tobacco Comment   Quit in 1989     Counseling given: Not Answered Comment: Quit in 1989   Clinical Intake:  Pre-visit preparation completed: Yes  Pain : No/denies pain Pain Score: 0-No pain     Nutritional Status: BMI > 30  Obese Nutritional Risks: None Diabetes: No  How often do you need to have someone help you when you read instructions, pamphlets, or other written materials from your doctor or pharmacy?: 1 - Never  Interpreter Needed?: No  Information entered by :: Mmarkoski, LPN  Past Medical History:  Diagnosis Date   Arthritis    Diverticulosis    Dizziness    medication related   Dizziness     Environmental allergies    GERD (gastroesophageal reflux disease)    Hiatal hernia    HOH (hard of hearing)    Hypercholesteremia    Kidney stones    Murmur    PE (pulmonary embolism)    Wheezing    Past Surgical History:  Procedure Laterality Date   CATARACT EXTRACTION W/PHACO Left 11/23/2015   Procedure: CATARACT EXTRACTION PHACO AND INTRAOCULAR LENS PLACEMENT ([place]);  Surgeon: Eulogio Bear, MD;  Location: [facility] ORS;  Service: Ophthalmology;  Laterality: Left;  Lot # [id number] H [place]: 01:03.8 AP%: 19.5 CDE: 12.46    CATARACT EXTRACTION W/PHACO Right 02/29/2016   Procedure: CATARACT EXTRACTION PHACO AND INTRAOCULAR LENS PLACEMENT ([facility]);  Surgeon: Eulogio Bear, MD;  Location: [facility] ORS;  Service: Ophthalmology;  Laterality: Right;  [place] 26.4 AP% 14.9 CDE 3.93 Fluid Pack Lot # [id number]   COLONOSCOPY  2008   kidney stent     TONSILLECTOMY     TONSILLECTOMY AND ADENOIDECTOMY  1960   Family History  Problem Relation Age of Onset   Arthritis Mother    Stroke Mother    Arthritis Father    Dementia Father    Cataracts Father    Hyperlipidemia Sister    Breast cancer Paternal Grandmother    Heart attack Paternal Grandfather    Social History   Socioeconomic History   Marital status: Single    Spouse name: Not on file   Number of children: 0   Years of education: Not on file  Highest education level: Master's degree (e.g., MA, MS, MEng, MEd, MSW, MBA)  Occupational History   Occupation: caregiver part time    Comment: retired  Scientist, product/process development strain: Not hard at [employer] insecurity:    Worry: Never true    Inability: Never true   Transportation needs:    Medical: No    Non-medical: No  Tobacco Use   Smoking status: Former Smoker    Types: Cigarettes   Smokeless tobacco: Never Used   Tobacco comment: Quit in 1989  Substance and Sexual Activity   Alcohol use: Yes    Alcohol/week: 5.0 - 6.0 standard drinks     Types: 5 - 6 Glasses of wine per week    Comment: or mix drink   Drug use: No   Sexual activity: Not on file  Lifestyle   Physical activity:    Days per week: 0 days    Minutes per session: 0 min   Stress: To some extent  Relationships   Social connections:    Talks on phone: Patient refused    Gets together: Patient refused    Attends religious service: Patient refused    Active member of club or organization: Patient refused    Attends meetings of clubs or organizations: Patient refused    Relationship status: Patient refused  Other Topics Concern   Not on file  Social History Narrative   Not on file    Outpatient Encounter Medications as of 10/14/2018  Medication Sig   aspirin 81 MG tablet Take 81 mg by mouth daily.   B-Complex TABS Take 1 tablet by mouth every morning.   Cholecalciferol (VITAMIN D3) 2000 UNITS TABS Take 5,000 Units by mouth every morning.    Coenzyme Q10 (COQ10) 200 MG CAPS Take 200 mg by mouth daily.   finasteride (PROSCAR) 5 MG tablet Take 5 mg by mouth every morning.   Krill Oil 350 MG CAPS Take 350 mg by mouth daily.   MAGNESIUM PO Take 400 mg by mouth daily.    Melatonin 1 MG TABS Take 1 mg by mouth at bedtime. Takes 1/2 tablet   Melatonin-Pyridoxine (MELATONEX PO) Take 1 tablet by mouth at bedtime. 0.5    Multiple Vitamin (MULTIVITAMIN) tablet Take 1 tablet by mouth daily.   Naproxen Sodium (ALEVE PO) Take by mouth as needed.   niacin 500 MG tablet Take 500 mg by mouth at bedtime.   Red Yeast Rice 600 MG CAPS Take 2 capsules by mouth daily.   tamsulosin (FLOMAX) 0.4 MG CAPS capsule Take 1 capsule (0.4 mg total) by mouth daily after supper.   acetaminophen (TYLENOL) 500 MG tablet Take 1,000 mg by mouth every 6 (six) hours as needed.    acetaminophen (TYLENOL) 650 MG CR tablet Take 650 mg by mouth every 8 (eight) hours as needed for pain.    guaiFENesin-codeine (ROBITUSSIN AC) 100-10 MG/5ML syrup Take 5 mLs by mouth 3 (three)  times daily as needed for cough. (Patient not taking: Reported on 10/14/2018)   levofloxacin (LEVAQUIN) 500 MG tablet Take 1 tablet (500 mg total) by mouth daily. (Patient not taking: Reported on 10/14/2018)   Multiple Vitamins-Minerals (MULTIVITAMIN PO) Take 1 tablet by mouth every morning.   TURMERIC PO Take 1 capsule by mouth every morning.   No facility-administered encounter medications on file as of 10/14/2018.     Activities of Daily Living In your present state of health, do you have any difficulty performing the following  activities: 10/14/2018  Hearing? Y  Comment Does not wear hearing aids.   Vision? N  Comment Wears readers as needed.  Difficulty concentrating or making decisions? N  Walking or climbing stairs? N  Comment Due to recent hip and leg pains.   Dressing or bathing? N  Doing errands, shopping? N  Preparing Food and eating ? N  Using the Toilet? N  In the past six months, have you accidently leaked urine? N  Do you have problems with loss of bowel control? Y  Comment Due to diet.   Managing your Medications? N  Managing your Finances? N  Housekeeping or managing your Housekeeping? N  Some recent data might be hidden    Patient Care Team: Chrismon, Vickki Muff, PA as PCP - General (Physician Assistant) Eulogio Bear, MD as Consulting Physician (Ophthalmology) Tana Felts, MD as Referring Physician Carlis Abbott, Ferdinand Cava, PA as Consulting Physician (Physician Assistant) Brain Hilts, MD as Referring Physician (Gastroenterology)   Assessment:   This is a routine wellness examination for [place].  Exercise Activities and Dietary recommendations Current Exercise Habits: The patient does not participate in regular exercise at present, Exercise limited by: None identified  Goals     DIET - INCREASE WATER INTAKE     Recommend increasing water intake to 4-6 glasses a day.     Increase water intake     Recommend increasing water intake to 6 glasses a  day.       Fall Risk: Fall Risk  10/14/2018 10/09/2017 10/08/2016  Falls in the past year? 0 Yes Yes  Number falls in past yr: - 1 1  Injury with Fall? - No No  Follow up - Falls prevention discussed -    FALL RISK PREVENTION PERTAINING TO THE HOME:  Any stairs in or around the home? Yes  If so, are there any without handrails? Yes   Home free of loose throw rugs in walkways, pet beds, electrical cords, etc? Yes  Adequate lighting in your home to reduce risk of falls? Yes   ASSISTIVE DEVICES UTILIZED TO PREVENT FALLS:  Life alert? No  Use of a cane, walker or w/c? No  Grab bars in the bathroom? No  Shower chair or bench in shower? No  Elevated toilet seat or a handicapped toilet? No   TIMED UP AND GO:  Was the test performed? No .    Depression Screen PHQ 2/9 Scores 10/14/2018 10/09/2017 10/08/2016 10/08/2016  PHQ - 2 Score 0 0 0 0  PHQ- 9 Score - - 4 -    Cognitive Function     6CIT Screen 10/14/2018 10/09/2017 10/08/2016  What Year? 0 points 0 points 0 points  What month? 0 points 0 points 0 points  What time? 0 points 0 points 0 points  Count back from 20 0 points 0 points 0 points  Months in reverse 0 points 0 points 2 points  Repeat phrase 0 points 0 points 0 points  Total Score 0 0 2    Immunization History  Administered Date(s) Administered   Influenza, High Dose Seasonal PF 06/28/2014, 05/29/2017   Influenza,inj,Quad PF,6+ Mos 04/27/2013, 03/28/2016   Influenza-Unspecified 06/08/2015, 03/17/2018   Pneumococcal Conjugate-13 06/28/2014   Pneumococcal Polysaccharide-23 05/06/2016   Td 09/06/2009    Qualifies for Shingles Vaccine? Yes . Due for Shingrix. Education has been provided regarding the importance of this vaccine. Pt has been advised to call insurance company to determine out of pocket expense. Advised may also  receive vaccine at local pharmacy or Health Dept. Verbalized acceptance and understanding.  Tdap: Up to date  Flu Vaccine: Up to  date  Pneumococcal Vaccine: Up to date  Screening Tests Health Maintenance  Topic Date Due   INFLUENZA VACCINE  01/09/2019   TETANUS/TDAP  09/07/2019   COLONOSCOPY  01/31/2027   Hepatitis C Screening  Completed   PNA vac Low Risk Adult  Completed   Cancer Screenings:  Colorectal Screening: Completed 02/13/17. Repeat every 10 years.  Lung Cancer Screening: (Low Dose CT Chest recommended if Age 35-80 years, 30 pack-year currently smoking OR have quit w/in 15years.) does not qualify.   Additional Screening:  Hepatitis C Screening: Up to date  Vision Screening: Recommended annual ophthalmology exams for early detection of glaucoma and other disorders of the eye.  Dental Screening: Recommended annual dental exams for proper oral hygiene  Community Resource Referral:  CRR required this visit?  No        Plan:  I have personally reviewed and addressed the Medicare Annual Wellness questionnaire and have noted the following in the patients chart:  A. Medical and social history B. Use of alcohol, tobacco or illicit drugs  C. Current medications and supplements D. Functional ability and status E.  Nutritional status F.  Physical activity G. Advance directives H. List of other physicians I.  Hospitalizations, surgeries, and ER visits in previous 12 months J.  [place] such as hearing and vision if needed, cognitive and depression L. Referrals and appointments   In addition, I have reviewed and discussed with patient certain preventive protocols, quality metrics, and best practice recommendations. A written personalized care plan for preventive services as well as general preventive health recommendations were provided to patient.   Glendora Score, LPN  12/15/4126 Nurse Health Advisor   Nurse Notes: None.   Reviewed documentation by Nurse Health Advisor for annual medicare wellness. Will discuss with patient at exam appointment.

## 2018-10-14 NOTE — Patient Instructions (Addendum)
Jacob Patterson , Thank you for taking time to come for your Medicare Wellness Visit. I appreciate your ongoing commitment to your health goals. Please review the following plan we discussed and let me know if I can assist you in the future.   Screening recommendations/referrals: Colonoscopy: Up to date, due 02/2027 Recommended yearly ophthalmology/optometry visit for glaucoma screening and checkup Recommended yearly dental visit for hygiene and checkup  Vaccinations: Influenza vaccine: Up to date Pneumococcal vaccine: Completed series Tdap vaccine: Up to date, due 02/2020 Shingles vaccine: Pt declines today.     Advanced directives: Advance directive discussed with you today. Even though you declined this today please call our office should you change your mind and we can give you the proper paperwork for you to fill out.  Conditions/risks identified: Continue to increase water intake to 6-8 8 oz glasses a day.   Next appointment: 11/16/18 @ 10:40 AM with Vernie Murders. Declined scheduling an AWV for 2021 at this time.   Preventive Care 23 Years and Older, Male Preventive care refers to lifestyle choices and visits with your health care provider that can promote health and wellness. What does preventive care include?  A yearly physical exam. This is also called an annual well check.  Dental exams once or twice a year.  Routine eye exams. Ask your health care provider how often you should have your eyes checked.  Personal lifestyle choices, including:  Daily care of your teeth and gums.  Regular physical activity.  Eating a healthy diet.  Avoiding tobacco and drug use.  Limiting alcohol use.  Practicing safe sex.  Taking low doses of aspirin every day.  Taking vitamin and mineral supplements as recommended by your health care provider. What happens during an annual well check? The services and screenings done by your health care provider during your annual well check  will depend on your age, overall health, lifestyle risk factors, and family history of disease. Counseling  Your health care provider may ask you questions about your:  Alcohol use.  Tobacco use.  Drug use.  Emotional well-being.  Home and relationship well-being.  Sexual activity.  Eating habits.  History of falls.  Memory and ability to understand (cognition).  Work and work Statistician. Screening  You may have the following tests or measurements:  Height, weight, and BMI.  Blood pressure.  Lipid and cholesterol levels. These may be checked every 5 years, or more frequently if you are over 22 years old.  Skin check.  Lung cancer screening. You may have this screening every year starting at age 45 if you have a 30-pack-year history of smoking and currently smoke or have quit within the past 15 years.  Fecal occult blood test (FOBT) of the stool. You may have this test every year starting at age 25.  Flexible sigmoidoscopy or colonoscopy. You may have a sigmoidoscopy every 5 years or a colonoscopy every 10 years starting at age 9.  Prostate cancer screening. Recommendations will vary depending on your family history and other risks.  Hepatitis C blood test.  Hepatitis B blood test.  Sexually transmitted disease (STD) testing.  Diabetes screening. This is done by checking your blood sugar (glucose) after you have not eaten for a while (fasting). You may have this done every 1-3 years.  Abdominal aortic aneurysm (AAA) screening. You may need this if you are a current or former smoker.  Osteoporosis. You may be screened starting at age 24 if you are at high risk. Talk  with your health care provider about your test results, treatment options, and if necessary, the need for more tests. Vaccines  Your health care provider may recommend certain vaccines, such as:  Influenza vaccine. This is recommended every year.  Tetanus, diphtheria, and acellular pertussis  (Tdap, Td) vaccine. You may need a Td booster every 10 years.  Zoster vaccine. You may need this after age 20.  Pneumococcal 13-valent conjugate (PCV13) vaccine. One dose is recommended after age 5.  Pneumococcal polysaccharide (PPSV23) vaccine. One dose is recommended after age 22. Talk to your health care provider about which screenings and vaccines you need and how often you need them. This information is not intended to replace advice given to you by your health care provider. Make sure you discuss any questions you have with your health care provider. Document Released: 06/23/2015 Document Revised: 02/14/2016 Document Reviewed: 03/28/2015 Elsevier Interactive Patient Education  2017 [place] Prevention in the Home Falls can cause injuries. They can happen to people of all ages. There are many things you can do to make your home safe and to help prevent falls. What can I do on the outside of my home?  Regularly fix the edges of walkways and driveways and fix any cracks.  Remove anything that might make you trip as you walk through a door, such as a raised step or threshold.  Trim any bushes or trees on the path to your home.  Use bright outdoor lighting.  Clear any walking paths of anything that might make someone trip, such as rocks or tools.  Regularly check to see if handrails are loose or broken. Make sure that both sides of any steps have handrails.  Any raised decks and porches should have guardrails on the edges.  Have any leaves, snow, or ice cleared regularly.  Use sand or salt on walking paths during winter.  Clean up any spills in your garage right away. This includes oil or grease spills. What can I do in the bathroom?  Use night lights.  Install grab bars by the toilet and in the tub and shower. Do not use towel bars as grab bars.  Use non-skid mats or decals in the tub or shower.  If you need to sit down in the shower, use a plastic, non-slip  stool.  Keep the floor dry. Clean up any water that spills on the floor as soon as it happens.  Remove soap buildup in the tub or shower regularly.  Attach bath mats securely with double-sided non-slip rug tape.  Do not have throw rugs and other things on the floor that can make you trip. What can I do in the bedroom?  Use night lights.  Make sure that you have a light by your bed that is easy to reach.  Do not use any sheets or blankets that are too big for your bed. They should not hang down onto the floor.  Have a firm chair that has side arms. You can use this for support while you get dressed.  Do not have throw rugs and other things on the floor that can make you trip. What can I do in the kitchen?  Clean up any spills right away.  Avoid walking on wet floors.  Keep items that you use a lot in easy-to-reach places.  If you need to reach something above you, use a strong step stool that has a grab bar.  Keep electrical cords out of the way.  Do  not use floor polish or wax that makes floors slippery. If you must use wax, use non-skid floor wax.  Do not have throw rugs and other things on the floor that can make you trip. What can I do with my stairs?  Do not leave any items on the stairs.  Make sure that there are handrails on both sides of the stairs and use them. Fix handrails that are broken or loose. Make sure that handrails are as long as the stairways.  Check any carpeting to make sure that it is firmly attached to the stairs. Fix any carpet that is loose or worn.  Avoid having throw rugs at the top or bottom of the stairs. If you do have throw rugs, attach them to the floor with carpet tape.  Make sure that you have a light switch at the top of the stairs and the bottom of the stairs. If you do not have them, ask someone to add them for you. What else can I do to help prevent falls?  Wear shoes that:  Do not have high heels.  Have rubber bottoms.  Are  comfortable and fit you well.  Are closed at the toe. Do not wear sandals.  If you use a stepladder:  Make sure that it is fully opened. Do not climb a closed stepladder.  Make sure that both sides of the stepladder are locked into place.  Ask someone to hold it for you, if possible.  Clearly mark and make sure that you can see:  Any grab bars or handrails.  First and last steps.  Where the edge of each step is.  Use tools that help you move around (mobility aids) if they are needed. These include:  Canes.  Walkers.  Scooters.  Crutches.  Turn on the lights when you go into a dark area. Replace any light bulbs as soon as they burn out.  Set up your furniture so you have a clear path. Avoid moving your furniture around.  If any of your floors are uneven, fix them.  If there are any pets around you, be aware of where they are.  Review your medicines with your doctor. Some medicines can make you feel dizzy. This can increase your chance of falling. Ask your doctor what other things that you can do to help prevent falls. This information is not intended to replace advice given to you by your health care provider. Make sure you discuss any questions you have with your health care provider. Document Released: 03/23/2009 Document Revised: 11/02/2015 Document Reviewed: 07/01/2014 Elsevier Interactive Patient Education  2017 [employer].

## 2018-11-16 ENCOUNTER — Encounter: Payer: Self-pay | Admitting: Family Medicine

## 2018-11-16 ENCOUNTER — Other Ambulatory Visit: Payer: Self-pay

## 2018-11-16 ENCOUNTER — Ambulatory Visit (INDEPENDENT_AMBULATORY_CARE_PROVIDER_SITE_OTHER): Payer: Medicare Other | Admitting: Family Medicine

## 2018-11-16 VITALS — BP 122/80 | HR 101 | Temp 98.3°F | Ht 67.0 in | Wt 193.0 lb

## 2018-11-16 DIAGNOSIS — M25552 Pain in left hip: Secondary | ICD-10-CM | POA: Diagnosis not present

## 2018-11-16 DIAGNOSIS — R972 Elevated prostate specific antigen [PSA]: Secondary | ICD-10-CM | POA: Diagnosis not present

## 2018-11-16 DIAGNOSIS — E7800 Pure hypercholesterolemia, unspecified: Secondary | ICD-10-CM

## 2018-11-16 NOTE — Progress Notes (Signed)
Jacob Patterson  MRN: [id number] DOB: [birth year]  Subjective:  HPI   The patient is a 68 year old male who presents today for a follow up to his Annual Medicare Wellness exam that was done on 10/14/18.  Patient Active Problem List   Diagnosis Date Noted  . Hilar adenopathy 04/03/2016  . Mediastinal adenopathy 03/24/2016  . Nodule of right lung 03/24/2016  . Elevated prostate specific antigen (PSA) 03/27/2015  . Hx of pulmonary infarction 03/27/2015  . Arthritis 10/31/2014  . 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 D-dimer, elevated 10/31/2014  . Abnormal prostate specific antigen 10/31/2014  . Acid reflux 10/31/2014  . Hypercholesteremia 10/31/2014  . Hernia, inguinal 10/31/2014  . Arthralgia of hip 10/31/2014  . Awareness of heartbeats 10/31/2014  . Pleural cavity effusion 10/31/2014  . Phlebitis after infusion 10/31/2014  . Calculus of kidney 10/31/2014  . CD (contact dermatitis) 02/01/2008  . Excessive urination at night 05/14/2007  . Can't get food down 04/11/2006    Past Medical History:  Diagnosis Date  . Arthritis   . Diverticulosis   . Dizziness    medication related  . Dizziness   . Environmental allergies   . GERD (gastroesophageal reflux disease)   . Hiatal hernia   . HOH (hard of hearing)   . Hypercholesteremia   . Kidney stones   . Murmur   . PE (pulmonary embolism)   . Wheezing     Social History   Socioeconomic History  . Marital status: Single    Spouse name: Not on file  . Number of children: 0  . Years of education: Not on file  . Highest education level: Master's degree (e.g., MA, MS, MEng, MEd, MSW, MBA)  Occupational History  . 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 Financial resource strain: Not hard at all  . Food insecurity:    Worry: Never true    Inability: Never true  . Transportation needs:    Medical: No    Non-medical: No  Tobacco Use  . Smoking status: Former Smoker    Types:  Cigarettes  . Smokeless tobacco: Never Used  . Tobacco comment: Quit in 1989  Substance and Sexual Activity  . Alcohol use: Yes    Alcohol/week: 5.0 - 6.0 standard drinks    Types: 5 - 6 Glasses of wine per week    Comment: or mix drink  . Drug use: No  . Sexual activity: Not on file  Lifestyle  . Physical activity:    Days per week: 0 days    Minutes per session: 0 min  . Stress: To some extent  Relationships  . Social connections:    Talks on phone: Patient refused    Gets together: Patient refused    Attends religious service: Patient refused    Active member of club or organization: Patient refused    Attends meetings of clubs or organizations: Patient refused    Relationship status: Patient refused  . Intimate partner violence:    Fear of current or ex partner: Patient refused    Emotionally abused: Patient refused    Physically abused: Patient refused    Forced sexual activity: Patient refused  Other Topics Concern  . Not on file  Social History Narrative  . Not on file    Outpatient Encounter Medications as of 11/16/2018  Medication Sig  . aspirin 81 MG tablet Take 81 mg by mouth daily.  Marland Kitchen B-Complex TABS  Take 1 tablet by mouth every morning.  . Cholecalciferol (VITAMIN D3) 2000 UNITS TABS Take 5,000 Units by mouth every morning.   . Coenzyme Q10 (COQ10) 200 MG CAPS Take 200 mg by mouth daily.  . finasteride (PROSCAR) 5 MG tablet Take 5 mg by mouth every morning.  Javier Docker Oil 350 MG CAPS Take 350 mg by mouth daily.  Marland Kitchen MAGNESIUM PO Take 400 mg by mouth daily.   . Melatonin 1 MG TABS Take 1 mg by mouth at bedtime. Takes 1/2 tablet  . Multiple Vitamin (MULTIVITAMIN) tablet Take 1 tablet by mouth daily.  . Naproxen Sodium (ALEVE PO) Take by mouth as needed.  . niacin 500 MG tablet Take 500 mg by mouth at bedtime.  . Red Yeast Rice 600 MG CAPS Take 2 capsules by mouth daily.  . tamsulosin (FLOMAX) 0.4 MG CAPS capsule Take 1 capsule (0.4 mg total) by mouth daily after  supper.  . [DISCONTINUED] acetaminophen (TYLENOL) 500 MG tablet Take 1,000 mg by mouth every 6 (six) hours as needed.   . [DISCONTINUED] acetaminophen (TYLENOL) 650 MG CR tablet Take 650 mg by mouth every 8 (eight) hours as needed for pain.   . [DISCONTINUED] guaiFENesin-codeine (ROBITUSSIN AC) 100-10 MG/5ML syrup Take 5 mLs by mouth 3 (three) times daily as needed for cough. (Patient not taking: Reported on 10/14/2018)  . [DISCONTINUED] levofloxacin (LEVAQUIN) 500 MG tablet Take 1 tablet (500 mg total) by mouth daily. (Patient not taking: Reported on 10/14/2018)  . [DISCONTINUED] Melatonin-Pyridoxine (MELATONEX PO) Take 1 tablet by mouth at bedtime. 0.5   . [DISCONTINUED] Multiple Vitamins-Minerals (MULTIVITAMIN PO) Take 1 tablet by mouth every morning.  . [DISCONTINUED] TURMERIC PO Take 1 capsule by mouth every morning.   No facility-administered encounter medications on file as of 11/16/2018.     Allergies  Allergen Reactions  . Naproxen     Other reaction(s): Other (See Comments) Burning sensation, break out in sores Other reaction(s): Other (See Comments) Burning sensation, break out in sores    Review of Systems  Constitutional: Negative.   HENT: Positive for congestion.        PND  Eyes: Negative.   Respiratory: Positive for cough, sputum production and wheezing. Negative for shortness of breath.   Cardiovascular: Negative.   Gastrointestinal: Negative.   Genitourinary: Negative.   Musculoskeletal: Positive for joint pain.  Skin: Negative.        Cat scratch from 3 days ago, improving  Neurological: Positive for sensory change (numbness in fingers).  Endo/Heme/Allergies: Negative.   Psychiatric/Behavioral: Positive for hallucinations (audio and visual, chronic and unchanged.).    Objective:  BP 122/80 (BP Location: Right Arm, Patient Position: Sitting, Cuff Size: Normal)   Pulse (!) 101   Temp 98.3 F (36.8 C) (Oral)   Ht 5\' 7"  (1.702 m)   Wt 193 lb (87.5 kg)   SpO2 95%    BMI 30.23 kg/m   Physical Exam  Constitutional: He is oriented to person, place, and time and well-developed, well-nourished, and in no distress.  HENT:  Head: Normocephalic.  Right Ear: External ear normal.  Left Ear: External ear normal.  Nose: Nose normal.  Mouth/Throat: Oropharynx is clear and moist.  Eyes: Pupils are equal, round, and reactive to light. Conjunctivae are normal.  Neck: Normal range of motion. Neck supple.  Cardiovascular: Normal rate and regular rhythm.  Pulmonary/Chest: Effort normal and breath sounds normal.  Abdominal: Soft. Bowel sounds are normal.  Genitourinary:    Genitourinary Comments: Deferred to  annual urology exam.   Musculoskeletal: Normal range of motion.  Neurological: He is alert and oriented to person, place, and time. He has normal reflexes. Gait normal.  Skin: No rash noted.  Psychiatric: Mood, memory and affect normal.  Nursing note and vitals reviewed.   Assessment and Plan :  1. Hypercholesteremia Tolerating Niacin 500 mg hs, Red Yeast Rice 600 mg 2 qd, Krill Oil 350 mg qd and Co-Q 10 200 mg qd with low fat diet. Denies muscle pain side effects and will check routine labs. Immunizations and colonoscopy screening up to date. AWE screening accomplished 10-14-18. - Comprehensive metabolic panel - Lipid Panel With LDL/HDL Ratio - TSH  2. Elevated prostate specific antigen (PSA) History 108 gram prostate with PSA up to 18+ in 2016. Was back down to 4.9 in May 2019 and followed by urologist/oncologist annually. No significant frequency, hesitancy or nocturia since taking Finasteride and Tamsulosin the past couple years. Will recheck CBC, CMP and PSA. Continue annual follow up with oncologist (Dr. Thomos Lemons). - CBC with Differential/Platelet - Comprehensive metabolic panel - PSA  3. 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 Marica Otter (orthopedist) in the past. Uses Aleve prn and ache is well controlled.  -  CBC with Differential/Platelet

## 2018-11-17 DIAGNOSIS — E7800 Pure hypercholesterolemia, unspecified: Secondary | ICD-10-CM | POA: Diagnosis not present

## 2018-11-17 DIAGNOSIS — M25552 Pain in left hip: Secondary | ICD-10-CM | POA: Diagnosis not present

## 2018-11-17 DIAGNOSIS — R972 Elevated prostate specific antigen [PSA]: Secondary | ICD-10-CM | POA: Diagnosis not present

## 2018-11-17 LAB — LIPID PANEL WITH LDL/HDL RATIO

## 2018-11-18 LAB — COMPREHENSIVE METABOLIC PANEL
ALT: 11 IU/L (ref 0–44)
AST: 18 IU/L (ref 0–40)
Albumin/Globulin Ratio: 1.8 (ref 1.2–2.2)
Albumin: 3.8 g/dL (ref 3.8–4.8)
Alkaline Phosphatase: 60 IU/L (ref 39–117)
BUN/Creatinine Ratio: 16 (ref 10–24)
BUN: 21 mg/dL (ref 8–27)
Bilirubin Total: 0.6 mg/dL (ref 0.0–1.2)
CO2: 20 mmol/L (ref 20–29)
Calcium: 9.3 mg/dL (ref 8.6–10.2)
Chloride: 107 mmol/L — ABNORMAL HIGH (ref 96–106)
Creatinine, Ser: 1.3 mg/dL — ABNORMAL HIGH (ref 0.76–1.27)
GFR calc Af Amer: 64 mL/min/{1.73_m2} (ref >59)
GFR calc non Af Amer: 56 mL/min/{1.73_m2} — ABNORMAL LOW (ref >59)
Globulin, Total: 2.1 g/dL (ref 1.5–4.5)
Glucose: 119 mg/dL — ABNORMAL HIGH (ref 65–99)
Potassium: 4.4 mmol/L (ref 3.5–5.2)
Sodium: 140 mmol/L (ref 134–144)
Total Protein: 5.9 g/dL — ABNORMAL LOW (ref 6.0–8.5)

## 2018-11-18 LAB — CBC WITH DIFFERENTIAL/PLATELET
Basophils Absolute: 0.1 10*3/uL (ref 0.0–0.2)
Basos: 1 %
EOS (ABSOLUTE): 1.2 10*3/uL — ABNORMAL HIGH (ref 0.0–0.4)
Eos: 13 %
Hematocrit: 51.6 % — ABNORMAL HIGH (ref 37.5–51.0)
Hemoglobin: 17.3 g/dL (ref 13.0–17.7)
Immature Grans (Abs): 0 10*3/uL (ref 0.0–0.1)
Immature Granulocytes: 0 %
Lymphocytes Absolute: 1 10*3/uL (ref 0.7–3.1)
Lymphs: 11 %
MCH: 31 pg (ref 26.6–33.0)
MCHC: 33.5 g/dL (ref 31.5–35.7)
MCV: 93 fL (ref 79–97)
Monocytes Absolute: 1.2 10*3/uL — ABNORMAL HIGH (ref 0.1–0.9)
Monocytes: 12 %
Neutrophils Absolute: 6.1 10*3/uL (ref 1.4–7.0)
Neutrophils: 63 %
Platelets: 213 10*3/uL (ref 150–450)
RBC: 5.58 x10E6/uL (ref 4.14–5.80)
RDW: 13 % (ref 11.6–15.4)
WBC: 9.7 10*3/uL (ref 3.4–10.8)

## 2018-11-18 LAB — LIPID PANEL WITH LDL/HDL RATIO
Cholesterol, Total: 189 mg/dL (ref 100–199)
HDL: 55 mg/dL (ref >39)
LDL Calculated: 121 mg/dL — ABNORMAL HIGH (ref 0–99)
LDl/HDL Ratio: 2.2 ratio (ref 0.0–3.6)
Triglycerides: 65 mg/dL (ref 0–149)
VLDL Cholesterol Cal: 13 mg/dL (ref 5–40)

## 2018-11-18 LAB — TSH: TSH: 3.5 u[IU]/mL (ref 0.450–4.500)

## 2018-11-18 LAB — PSA: Prostate Specific Ag, Serum: 5.3 ng/mL — ABNORMAL HIGH (ref 0.0–4.0)

## 2018-11-20 ENCOUNTER — Telehealth: Payer: Self-pay

## 2018-11-20 NOTE — Telephone Encounter (Signed)
Patient notified of lab results. He states that he did not want to make a follow up appointment at this time.

## 2018-11-20 NOTE — Telephone Encounter (Signed)
-----   Message from Margo Common, [place] sent at 11/19/2018  5:52 PM EDT ----- PSA much improved on Finasteride and Tamsulosin. Continue the present daily dosage. Thyroid normal and cholesterol improving. Continue to watch diet, exercise regularly and take the supplements for cholesterol control (Red Yeast Rice, Co-Q 10, Krill and Niacin). Recheck progress in 6 months.

## 2019-01-08 ENCOUNTER — Ambulatory Visit (INDEPENDENT_AMBULATORY_CARE_PROVIDER_SITE_OTHER): Payer: Medicare Other | Admitting: Family Medicine

## 2019-01-08 ENCOUNTER — Encounter: Payer: Self-pay | Admitting: Family Medicine

## 2019-01-08 ENCOUNTER — Other Ambulatory Visit: Payer: Self-pay

## 2019-01-08 VITALS — BP 124/72 | HR 76 | Temp 98.0°F | Resp 16 | Wt 189.8 lb

## 2019-01-08 DIAGNOSIS — R42 Dizziness and giddiness: Secondary | ICD-10-CM

## 2019-01-08 DIAGNOSIS — Z8709 Personal history of other diseases of the respiratory system: Secondary | ICD-10-CM

## 2019-01-08 NOTE — Progress Notes (Signed)
Patient: Jacob Patterson Male    DOB: [birth year]   68 y.o.   MRN: [id number] Visit Date: 01/08/2019  Today's Provider: Vernie Murders, PA   Chief Complaint  Patient presents with  . Dizziness   Subjective:     HPI Patient states that he has been experiencing dizziness for the last day. He was a little wobbly, but this morning the dizziness  lasted for few hours.   Past Medical History:  Diagnosis Date  . Arthritis   . Diverticulosis   . Dizziness    medication related  . Dizziness   . Environmental allergies   . GERD (gastroesophageal reflux disease)   . Hiatal hernia   . HOH (hard of hearing)   . Hypercholesteremia   . Kidney stones   . Murmur   . PE (pulmonary embolism)   . Wheezing    Past Surgical History:  Procedure Laterality Date  . CATARACT EXTRACTION W/PHACO Left 11/23/2015   Procedure: CATARACT EXTRACTION PHACO AND INTRAOCULAR LENS PLACEMENT (IOC);  Surgeon: Eulogio Bear, MD;  Location: [facility] ORS;  Service: Ophthalmology;  Laterality: Left;  Lot # [id number] H [place]: 01:03.8 AP%: 19.5 CDE: 12.46   . CATARACT EXTRACTION W/PHACO Right 02/29/2016   Procedure: CATARACT EXTRACTION PHACO AND INTRAOCULAR LENS PLACEMENT (IOC);  Surgeon: Eulogio Bear, MD;  Location: [facility] ORS;  Service: Ophthalmology;  Laterality: Right;  [place] 26.4 AP% 14.9 CDE 3.93 Fluid Pack Lot # [id number]  . COLONOSCOPY  2008  . kidney stent    . TONSILLECTOMY    . TONSILLECTOMY AND ADENOIDECTOMY  1960   Family History  Problem Relation Age of Onset  . Arthritis Mother   . Stroke Mother   . Arthritis Father   . Dementia Father   . Cataracts Father   . Hyperlipidemia Sister   . Breast cancer Paternal Grandmother   . Heart attack Paternal Grandfather    Allergies  Allergen Reactions  . Naproxen     Other reaction(s): Other (See Comments) Burning sensation, break out in sores Other reaction(s): Other (See Comments) Burning sensation, break out in sores    Current Outpatient  Medications:  .  aspirin 81 MG tablet, Take 81 mg by mouth daily., Disp: , Rfl:  .  B-Complex TABS, Take 1 tablet by mouth every morning., Disp: , Rfl:  .  Cholecalciferol (VITAMIN D3) 2000 UNITS TABS, Take 5,000 Units by mouth every morning. , Disp: , Rfl:  .  Coenzyme Q10 (COQ10) 200 MG CAPS, Take 200 mg by mouth daily., Disp: , Rfl:  .  finasteride (PROSCAR) 5 MG tablet, Take 5 mg by mouth every morning., Disp: , Rfl:  .  Krill Oil 350 MG CAPS, Take 350 mg by mouth daily., Disp: , Rfl:  .  MAGNESIUM PO, Take 400 mg by mouth daily. , Disp: , Rfl:  .  Melatonin 1 MG TABS, Take 1 mg by mouth at bedtime. Takes 1/2 tablet, Disp: , Rfl:  .  Multiple Vitamin (MULTIVITAMIN) tablet, Take 1 tablet by mouth daily., Disp: , Rfl:  .  Naproxen Sodium (ALEVE PO), Take by mouth as needed., Disp: , Rfl:  .  niacin 500 MG tablet, Take 500 mg by mouth at bedtime., Disp: , Rfl:  .  Red Yeast Rice 600 MG CAPS, Take 2 capsules by mouth daily., Disp: , Rfl:  .  tamsulosin (FLOMAX) 0.4 MG CAPS capsule, Take 1 capsule (0.4 mg total) by mouth daily after supper., Disp: 30 capsule,  Rfl: 3  Review of Systems  Neurological: Positive for dizziness.  All other systems reviewed and are negative.  Social History   Tobacco Use  . Smoking status: Former Smoker    Types: Cigarettes  . Smokeless tobacco: Never Used  . Tobacco comment: Quit in 1989  Substance Use Topics  . Alcohol use: Yes    Alcohol/week: 5.0 - 6.0 standard drinks    Types: 5 - 6 Glasses of wine per week    Comment: or mix drink     Objective:   BP 124/72 (BP Location: Right Arm, Patient Position: Sitting, Cuff Size: Normal)   Pulse 76   Temp 98 F (36.7 C) (Oral)   Resp 16   Wt 189 lb 12.8 oz (86.1 kg)   SpO2 98%   BMI 29.73 kg/m  Vitals:   01/08/19 1428  BP: 124/72  Pulse: 76  Resp: 16  Temp: 98 F (36.7 C)  TempSrc: Oral  SpO2: 98%  Weight: 189 lb 12.8 oz (86.1 kg)   Physical Exam Constitutional:      General: He is not in  acute distress.    Appearance: He is well-developed.  HENT:     Head: Normocephalic and atraumatic.     Right Ear: Hearing and tympanic membrane normal.     Left Ear: Hearing and tympanic membrane normal.     Nose: Nose normal. No congestion or rhinorrhea.     Mouth/Throat:     Mouth: Mucous membranes are moist.     Pharynx: Oropharynx is clear. No posterior oropharyngeal erythema.  Eyes:     General: Lids are normal. No scleral icterus.       Right eye: No discharge.        Left eye: No discharge.     Extraocular Movements: Extraocular movements intact.     Conjunctiva/sclera: Conjunctivae normal.  Neck:     Musculoskeletal: Normal range of motion and neck supple.     Vascular: No carotid bruit.  Cardiovascular:     Rate and Rhythm: Normal rate and regular rhythm.     Pulses: Normal pulses.     Heart sounds: Normal heart sounds.  Pulmonary:     Effort: Pulmonary effort is normal. No respiratory distress.     Breath sounds: Normal breath sounds.  Abdominal:     General: Bowel sounds are normal.     Palpations: Abdomen is soft.  Musculoskeletal: Normal range of motion.  Skin:    Findings: No lesion or rash.  Neurological:     Mental Status: He is alert and oriented to person, place, and time.     Comments: Minimal balance issues with testing heel-to-toe walking. No foot drop, numbness in extremities, or muscle weakness - strength 5/5 throughout. DTR's symmetric.  Psychiatric:        Speech: Speech normal.        Behavior: Behavior normal.        Thought Content: Thought content normal.       Assessment & Plan    1. Dizziness Felt a little "wobbly" for a few hours yesterday and today. Drove himself to the office without difficulties. No head injury, dyspnea, palpitations, chest pains, carotid bruits or URI symptoms. No recent cough, headache, chills, fever or muscle weakness. Suspect heat exposure and slight dehydration versus viral vestibular illness. May use Meclizine prn  and should increase fluid intake with a little Gatorade. If no better over the weekend, should consider labs to reassess electrolytes and renal function.  2. Hx of pulmonary infarction Resolved without chest pains or dyspnea today. Had pulmonary infarct from PE in May 2016.      Vernie Murders, PA  [facility]

## 2019-01-21 DIAGNOSIS — M1612 Unilateral primary osteoarthritis, left hip: Secondary | ICD-10-CM | POA: Diagnosis not present

## 2019-02-01 DIAGNOSIS — M25552 Pain in left hip: Secondary | ICD-10-CM | POA: Diagnosis not present

## 2019-02-01 DIAGNOSIS — M25652 Stiffness of left hip, not elsewhere classified: Secondary | ICD-10-CM | POA: Diagnosis not present

## 2019-02-08 ENCOUNTER — Ambulatory Visit (INDEPENDENT_AMBULATORY_CARE_PROVIDER_SITE_OTHER): Payer: Medicare Other | Admitting: Family Medicine

## 2019-02-08 ENCOUNTER — Other Ambulatory Visit: Payer: Self-pay

## 2019-02-08 ENCOUNTER — Encounter: Payer: Self-pay | Admitting: Family Medicine

## 2019-02-08 VITALS — BP 122/78 | HR 84 | Temp 97.3°F | Resp 16 | Ht 67.0 in | Wt 195.0 lb

## 2019-02-08 DIAGNOSIS — Z23 Encounter for immunization: Secondary | ICD-10-CM

## 2019-02-08 DIAGNOSIS — E7800 Pure hypercholesterolemia, unspecified: Secondary | ICD-10-CM | POA: Diagnosis not present

## 2019-02-08 DIAGNOSIS — Z01818 Encounter for other preprocedural examination: Secondary | ICD-10-CM

## 2019-02-08 DIAGNOSIS — R7309 Other abnormal glucose: Secondary | ICD-10-CM | POA: Diagnosis not present

## 2019-02-08 DIAGNOSIS — M1612 Unilateral primary osteoarthritis, left hip: Secondary | ICD-10-CM

## 2019-02-08 DIAGNOSIS — Z8709 Personal history of other diseases of the respiratory system: Secondary | ICD-10-CM

## 2019-02-08 LAB — POCT URINALYSIS DIPSTICK
Bilirubin, UA: NEGATIVE
Blood, UA: NEGATIVE
Glucose, UA: NEGATIVE
Ketones, UA: NEGATIVE
Leukocytes, UA: NEGATIVE
Nitrite, UA: NEGATIVE
Protein, UA: NEGATIVE
Spec Grav, UA: 1.02 (ref 1.010–1.025)
Urobilinogen, UA: 0.2 E.U./dL
pH, UA: 6 (ref 5.0–8.0)

## 2019-02-08 LAB — POCT GLYCOSYLATED HEMOGLOBIN (HGB A1C)
Est. average glucose Bld gHb Est-mCnc: 111
Hemoglobin A1C: 5.5 % (ref 4.0–5.6)

## 2019-02-08 NOTE — Progress Notes (Signed)
Patient: Jacob Patterson Male    DOB: 08-05-1950   68 y.o.   MRN: [id number] Visit Date: 02/08/2019  Today's Provider: Vernie Murders, PA   Chief Complaint  Patient presents with  . Pre-op Exam   Subjective:    This 68 year old male is scheduled for leftTHA by Dr. Harlow Mares (orthopedist) on 02-12-19. Has severe degeneration in the left hip. No longer on any NSAID's or ASA for the past week and has been under preop pandemic isolation.   Past Medical History:  Diagnosis Date  . Arthritis   . Diverticulosis   . Dizziness    medication related  . Dizziness   . Environmental allergies   . GERD (gastroesophageal reflux disease)   . Hiatal hernia   . HOH (hard of hearing)   . Hypercholesteremia   . Kidney stones   . Murmur   . PE (pulmonary embolism)   . Wheezing    Past Surgical History:  Procedure Laterality Date  . CATARACT EXTRACTION W/PHACO Left 11/23/2015   Procedure: CATARACT EXTRACTION PHACO AND INTRAOCULAR LENS PLACEMENT (IOC);  Surgeon: Eulogio Bear, MD;  Location: [facility] ORS;  Service: Ophthalmology;  Laterality: Left;  Lot # [id number] H [place]: 01:03.8 AP%: 19.5 CDE: 12.46   . CATARACT EXTRACTION W/PHACO Right 02/29/2016   Procedure: CATARACT EXTRACTION PHACO AND INTRAOCULAR LENS PLACEMENT (IOC);  Surgeon: Eulogio Bear, MD;  Location: [facility] ORS;  Service: Ophthalmology;  Laterality: Right;  [place] 26.4 AP% 14.9 CDE 3.93 Fluid Pack Lot # [id number]  . COLONOSCOPY  2008  . kidney stent    . TONSILLECTOMY    . TONSILLECTOMY AND ADENOIDECTOMY  1960   Family History  Problem Relation Age of Onset  . Arthritis Mother   . Stroke Mother   . Arthritis Father   . Dementia Father   . Cataracts Father   . Hyperlipidemia Sister   . Breast cancer Paternal Grandmother   . Heart attack Paternal Grandfather    Allergies  Allergen Reactions  . Naproxen     Other reaction(s): Other (See Comments) Burning sensation, break out in sores Other reaction(s): Other (See  Comments) Burning sensation, break out in sores     Current Outpatient Medications:  .  aspirin 81 MG tablet, Take 81 mg by mouth daily., Disp: , Rfl:  .  B-Complex TABS, Take 1 tablet by mouth every morning., Disp: , Rfl:  .  Cholecalciferol (VITAMIN D3) 2000 UNITS TABS, Take 5,000 Units by mouth every morning. , Disp: , Rfl:  .  Coenzyme Q10 (COQ10) 200 MG CAPS, Take 200 mg by mouth daily., Disp: , Rfl:  .  finasteride (PROSCAR) 5 MG tablet, Take 5 mg by mouth every morning., Disp: , Rfl:  .  Krill Oil 350 MG CAPS, Take 350 mg by mouth daily., Disp: , Rfl:  .  MAGNESIUM PO, Take 400 mg by mouth daily. , Disp: , Rfl:  .  Melatonin 1 MG TABS, Take 1 mg by mouth at bedtime. Takes 1/2 tablet, Disp: , Rfl:  .  Multiple Vitamin (MULTIVITAMIN) tablet, Take 1 tablet by mouth daily., Disp: , Rfl:  .  Naproxen Sodium (ALEVE PO), Take by mouth as needed., Disp: , Rfl:  .  niacin 500 MG tablet, Take 500 mg by mouth at bedtime., Disp: , Rfl:  .  Red Yeast Rice 600 MG CAPS, Take 2 capsules by mouth daily., Disp: , Rfl:  .  tamsulosin (FLOMAX) 0.4 MG CAPS capsule, Take  1 capsule (0.4 mg total) by mouth daily after supper., Disp: 30 capsule, Rfl: 3  Review of Systems  Constitutional: Negative.   Respiratory: Negative.   Cardiovascular: Negative.   Musculoskeletal: Positive for arthralgias.    Social History   Tobacco Use  . Smoking status: Former Smoker    Types: Cigarettes  . Smokeless tobacco: Never Used  . Tobacco comment: Quit in 1989  Substance Use Topics  . Alcohol use: Yes    Alcohol/week: 5.0 - 6.0 standard drinks    Types: 5 - 6 Glasses of wine per week    Comment: or mix drink     Objective:   BP 122/78 (BP Location: Left Arm, Patient Position: Sitting, Cuff Size: Normal)   Pulse 84   Temp (!) 97.3 F (36.3 C) (Temporal)   Resp 16   Ht 5\' 7"  (1.702 m)   Wt 195 lb (88.5 kg)   SpO2 98%   BMI 30.54 kg/m  Vitals:   02/08/19 1030  BP: 122/78  Pulse: 84  Resp: 16  Temp:  (!) 97.3 F (36.3 C)  TempSrc: Temporal  SpO2: 98%  Weight: 195 lb (88.5 kg)  Height: 5\' 7"  (1.702 m)  Body mass index is 30.54 kg/m.  Physical Exam Constitutional:      General: He is not in acute distress.    Appearance: He is well-developed.  HENT:     Head: Normocephalic and atraumatic.     Right Ear: Hearing normal.     Left Ear: Hearing normal.     Nose: Nose normal.  Eyes:     General: Lids are normal. No scleral icterus.       Right eye: No discharge.        Left eye: No discharge.     Conjunctiva/sclera: Conjunctivae normal.  Neck:     Musculoskeletal: Normal range of motion and neck supple.  Cardiovascular:     Rate and Rhythm: Normal rate and regular rhythm.     Pulses: Normal pulses.     Heart sounds: Normal heart sounds.  Pulmonary:     Effort: Pulmonary effort is normal. No respiratory distress.  Musculoskeletal:        General: Swelling and tenderness present.     Comments: Many varicose veins and lower extremity swelling from venous insufficiency. Very limited left hip ROM with pain to flex hip or adduct/abduct hip.  Skin:    Findings: No lesion or rash.  Neurological:     Mental Status: He is alert and oriented to person, place, and time.  Psychiatric:        Speech: Speech normal.        Behavior: Behavior normal.        Thought Content: Thought content normal.     Results for orders placed or performed in visit on 02/08/19  POCT glycosylated hemoglobin (Hb A1C)  Result Value Ref Range   Hemoglobin A1C 5.5 4.0 - 5.6 %   Est. average glucose Bld gHb Est-mCnc 111   POCT urinalysis dipstick  Result Value Ref Range   Color, UA yellow    Clarity, UA clear    Glucose, UA Negative Negative   Bilirubin, UA Negative    Ketones, UA Negative    Spec Grav, UA 1.020 1.010 - 1.025   Blood, UA Negative    pH, UA 6.0 5.0 - 8.0   Protein, UA Negative Negative   Urobilinogen, UA 0.2 0.2 or 1.0 E.U./dL   Nitrite, UA Negative  Leukocytes, UA Negative  Negative   Appearance     Odor     CBC Latest Ref Rng & Units 11/17/2018 07/27/2018 05/15/2018  WBC 3.4 - 10.8 x10E3/uL 9.7 9.0 8.5  Hemoglobin 13.0 - 17.7 g/dL 17.3 16.8 17.0  Hematocrit 37.5 - 51.0 % 51.6(H) 49.7 50.3  Platelets 150 - 450 x10E3/uL 213 265 267   CMP Latest Ref Rng & Units 11/17/2018 05/15/2018 10/30/2017  Glucose 65 - 99 mg/dL 119(H) 112(H) 113(H)  BUN 8 - 27 mg/dL 21 18 20   Creatinine 0.76 - 1.27 mg/dL 1.30(H) 1.28(H) 1.27  Sodium 134 - 144 mmol/L 140 143 142  Potassium 3.5 - 5.2 mmol/L 4.4 4.4 4.4  Chloride 96 - 106 mmol/L 107(H) 101 106  CO2 20 - 29 mmol/L 20 24 20   Calcium 8.6 - 10.2 mg/dL 9.3 9.8 9.3  Total Protein 6.0 - 8.5 g/dL 5.9(L) 6.6 6.2  Total Bilirubin 0.0 - 1.2 mg/dL 0.6 0.4 0.4  Alkaline Phos 39 - 117 IU/L 60 80 88  AST 0 - 40 IU/L 18 25 19   ALT 0 - 44 IU/L 11 20 14    Lipid Panel     Component Value Date/Time   CHOL 189 11/17/2018 0923   TRIG 65 11/17/2018 0923   HDL 55 11/17/2018 0923   CHOLHDL 4.1 10/30/2017 0841   CHOLHDL 3.0 06/02/2017 1030   LDLCALC 121 (H) 11/17/2018 0923   LDLCALC 125 (H) 06/02/2017 1030       Assessment & Plan    1. Pre-op exam General health stable with severe degenerative disease of the left hip. Scheduled for left THA anterior approach on 02-12-19. 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 Hgb A1C is 5.5% and urinalysis normal today. PSA on 11-17-18 was 5.3 with a history of 108 gram prostate in 2016 with PSA 18.4 due to BPH on MRI. Also, had pulmonary PE with small infarct RLL 10-14-14 following phlebitis from IV contrast infusion. No recurrence and was treated with Eliquis 5 mg BID for a year. His medical risk for surgery is low to moderate. Will send copy of this exam and labs to Dr. Harlow Mares (orthopedic surgeon). - EKG 12-Lead - POCT glycosylated hemoglobin (Hb A1C) - POCT urinalysis dipstick  2. Primary osteoarthritis of left hip Degeneration of the left hip causing  chronic pain and mobility problems. The left hip has very limited ROM. Scheduled for hip replacement on 02-12-19.  3. Hypercholesteremia LDL 121 on 11-17-18 but the remainder of the lipid panel was normal. Presently trying to limit fats in diet and use Co-Q 10 200 mg qd, Red Yeast Rice 600 mg 2 caps daily and Niacin 500 mg hs. Liver and kidney function tests were normal on 11-17-18. - POCT glycosylated hemoglobin (Hb A1C)  4. Abnormal blood sugar History of mild glucose elevation (only 119) on the last labs 11-17-18. Hgb A1C was 5.5% today. No polydipsia, polyphagia or blurring of vision.  - POCT glycosylated hemoglobin (Hb A1C)  5. Need for influenza vaccination - Flu Vaccine QUAD High Dose(Fluad)  6. Hx of pulmonary infarction History of PE with a small infarct in the RLL in 2016 that occurred after an episode of phlebitis from IV contrast infusion for an MRI. No further chest pains, dyspnea or hemoptysis since treatment for a year with Eliquis and ASA. No longer on any Eliquis only taking ASA 81 mg daily (stopped it a week ago in preparation for surgery).  Vernie Murders, PA  [facility]

## 2019-02-12 DIAGNOSIS — R000 Tachycardia, unspecified: Secondary | ICD-10-CM | POA: Diagnosis not present

## 2019-02-12 DIAGNOSIS — Z79899 Other long term (current) drug therapy: Secondary | ICD-10-CM | POA: Diagnosis not present

## 2019-02-12 DIAGNOSIS — R42 Dizziness and giddiness: Secondary | ICD-10-CM | POA: Diagnosis present

## 2019-02-12 DIAGNOSIS — Z86711 Personal history of pulmonary embolism: Secondary | ICD-10-CM | POA: Diagnosis not present

## 2019-02-12 DIAGNOSIS — Z91041 Radiographic dye allergy status: Secondary | ICD-10-CM | POA: Diagnosis not present

## 2019-02-12 DIAGNOSIS — N400 Enlarged prostate without lower urinary tract symptoms: Secondary | ICD-10-CM | POA: Diagnosis present

## 2019-02-12 DIAGNOSIS — Z03818 Encounter for observation for suspected exposure to other biological agents ruled out: Secondary | ICD-10-CM | POA: Diagnosis not present

## 2019-02-12 DIAGNOSIS — E785 Hyperlipidemia, unspecified: Secondary | ICD-10-CM | POA: Diagnosis not present

## 2019-02-12 DIAGNOSIS — M1612 Unilateral primary osteoarthritis, left hip: Secondary | ICD-10-CM | POA: Diagnosis not present

## 2019-02-12 DIAGNOSIS — R011 Cardiac murmur, unspecified: Secondary | ICD-10-CM | POA: Diagnosis present

## 2019-02-12 DIAGNOSIS — D72829 Elevated white blood cell count, unspecified: Secondary | ICD-10-CM | POA: Diagnosis not present

## 2019-02-12 DIAGNOSIS — Z87442 Personal history of urinary calculi: Secondary | ICD-10-CM | POA: Diagnosis not present

## 2019-02-12 DIAGNOSIS — Z87891 Personal history of nicotine dependence: Secondary | ICD-10-CM | POA: Diagnosis not present

## 2019-02-12 DIAGNOSIS — Z87820 Personal history of traumatic brain injury: Secondary | ICD-10-CM | POA: Diagnosis not present

## 2019-02-12 DIAGNOSIS — N401 Enlarged prostate with lower urinary tract symptoms: Secondary | ICD-10-CM | POA: Diagnosis not present

## 2019-02-12 DIAGNOSIS — Z96642 Presence of left artificial hip joint: Secondary | ICD-10-CM | POA: Diagnosis not present

## 2019-02-12 DIAGNOSIS — L509 Urticaria, unspecified: Secondary | ICD-10-CM | POA: Diagnosis not present

## 2019-02-12 DIAGNOSIS — Z886 Allergy status to analgesic agent status: Secondary | ICD-10-CM | POA: Diagnosis not present

## 2019-02-19 DIAGNOSIS — M25652 Stiffness of left hip, not elsewhere classified: Secondary | ICD-10-CM | POA: Diagnosis not present

## 2019-02-19 DIAGNOSIS — M25552 Pain in left hip: Secondary | ICD-10-CM | POA: Diagnosis not present

## 2019-02-22 DIAGNOSIS — M25652 Stiffness of left hip, not elsewhere classified: Secondary | ICD-10-CM | POA: Diagnosis not present

## 2019-02-22 DIAGNOSIS — M25552 Pain in left hip: Secondary | ICD-10-CM | POA: Diagnosis not present

## 2019-02-24 DIAGNOSIS — M25552 Pain in left hip: Secondary | ICD-10-CM | POA: Diagnosis not present

## 2019-02-24 DIAGNOSIS — M25652 Stiffness of left hip, not elsewhere classified: Secondary | ICD-10-CM | POA: Diagnosis not present

## 2019-02-26 DIAGNOSIS — R972 Elevated prostate specific antigen [PSA]: Secondary | ICD-10-CM | POA: Diagnosis not present

## 2019-03-01 DIAGNOSIS — M25652 Stiffness of left hip, not elsewhere classified: Secondary | ICD-10-CM | POA: Diagnosis not present

## 2019-03-01 DIAGNOSIS — M25552 Pain in left hip: Secondary | ICD-10-CM | POA: Diagnosis not present

## 2019-03-05 DIAGNOSIS — M25552 Pain in left hip: Secondary | ICD-10-CM | POA: Diagnosis not present

## 2019-03-05 DIAGNOSIS — M25652 Stiffness of left hip, not elsewhere classified: Secondary | ICD-10-CM | POA: Diagnosis not present

## 2019-03-10 DIAGNOSIS — M25552 Pain in left hip: Secondary | ICD-10-CM | POA: Diagnosis not present

## 2019-03-10 DIAGNOSIS — M25652 Stiffness of left hip, not elsewhere classified: Secondary | ICD-10-CM | POA: Diagnosis not present

## 2019-03-12 ENCOUNTER — Ambulatory Visit (INDEPENDENT_AMBULATORY_CARE_PROVIDER_SITE_OTHER): Payer: Medicare Other | Admitting: Family Medicine

## 2019-03-12 ENCOUNTER — Encounter: Payer: Self-pay | Admitting: Family Medicine

## 2019-03-12 DIAGNOSIS — R05 Cough: Secondary | ICD-10-CM | POA: Diagnosis not present

## 2019-03-12 DIAGNOSIS — R059 Cough, unspecified: Secondary | ICD-10-CM

## 2019-03-12 NOTE — Progress Notes (Signed)
Jacob Patterson  MRN: [id number] DOB: 12-Aug-1950 Virtual Visit via Telephone Note  I connected with Hannah Beat on 03/12/19 at  8:20 AM EDT by telephone and verified that I am speaking with the correct person using two identifiers.  Location: Patient: Home  Provider: Office   I discussed the limitations, risks, security and privacy concerns of performing an evaluation and management service by telephone and the availability of in person appointments. I also discussed with the patient that there may be a patient responsible charge related to this service. The patient expressed understanding and agreed to proceed.  Subjective:  HPI  The patient is a 68 year old male who presents via phone visit.  He complains of having cough productive of thick but clear phlegm for about 1 week. He states he has no other associated symptoms except possibly some shortness of breath and decreased energy.  He is not certain these symptoms are a result of the cough and chest congestion but states he at times feels a little short of breath.  He reports that he has had hip surgery 4 weeks ago and thinks his energy level is a result of that.   Patient Active Problem List   Diagnosis Date Noted  . Hilar adenopathy 04/03/2016  . Mediastinal adenopathy 03/24/2016  . Nodule of right lung 03/24/2016  . Elevated prostate specific antigen (PSA) 03/27/2015  . Hx of pulmonary infarction 03/27/2015  . Arthritis 10/31/2014  . 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 Abnormal prostate specific antigen 10/31/2014  . Acid reflux 10/31/2014  . Hypercholesteremia 10/31/2014  . Hernia, inguinal 10/31/2014  . Arthralgia of hip 10/31/2014  . Awareness of heartbeats 10/31/2014  . Pleural cavity effusion 10/31/2014  . Phlebitis after infusion 10/31/2014  . Calculus of kidney 10/31/2014  . CD (contact dermatitis) 02/01/2008  . Excessive urination at night 05/14/2007  . Can't get food down 04/11/2006    Past Medical  History:  Diagnosis Date  . Arthritis   . Diverticulosis   . Dizziness    medication related  . Dizziness   . Environmental allergies   . GERD (gastroesophageal reflux disease)   . Hiatal hernia   . HOH (hard of hearing)   . Hypercholesteremia   . Kidney stones   . Murmur   . PE (pulmonary embolism)   . Wheezing     Social History   Socioeconomic History  . Marital status: Single    Spouse name: Not on file  . Number of children: 0  . Years of education: Not on file  . Highest education level: Master's degree (e.g., MA, MS, MEng, MEd, MSW, MBA)  Occupational History  . 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 Financial resource strain: Not hard at all  . Food insecurity    Worry: Never true    Inability: Never true  . Transportation needs    Medical: No    Non-medical: No  Tobacco Use  . Smoking status: Former Smoker    Types: Cigarettes  . Smokeless tobacco: Never Used  . Tobacco comment: Quit in 1989  Substance and Sexual Activity  . Alcohol use: Yes    Alcohol/week: 5.0 - 6.0 standard drinks    Types: 5 - 6 Glasses of wine per week    Comment: or mix drink  . Drug use: No  . Sexual activity: Not on file  Lifestyle  . Physical activity    Days per week:  0 days    Minutes per session: 0 min  . Stress: To some extent  Relationships  . Social Herbalist on phone: Patient refused    Gets together: Patient refused    Attends religious service: Patient refused    Active member of club or organization: Patient refused    Attends meetings of clubs or organizations: Patient refused    Relationship status: Patient refused  . Intimate partner violence    Fear of current or ex partner: Patient refused    Emotionally abused: Patient refused    Physically abused: Patient refused    Forced sexual activity: Patient refused  Other Topics Concern  . Not on file  Social History Narrative  . Not on file    Outpatient  Encounter Medications as of 03/12/2019  Medication Sig  . aspirin 81 MG tablet Take 81 mg by mouth daily.  Marland Kitchen B-Complex TABS Take 1 tablet by mouth every morning.  . Cholecalciferol (VITAMIN D3) 2000 UNITS TABS Take 5,000 Units by mouth every morning.   . Coenzyme Q10 (COQ10) 200 MG CAPS Take 200 mg by mouth daily.  . finasteride (PROSCAR) 5 MG tablet Take 5 mg by mouth every morning.  Javier Docker Oil 350 MG CAPS Take 350 mg by mouth daily.  Marland Kitchen MAGNESIUM PO Take 400 mg by mouth daily.   . Melatonin 1 MG TABS Take 1 mg by mouth at bedtime. Takes 1/2 tablet  . Multiple Vitamin (MULTIVITAMIN) tablet Take 1 tablet by mouth daily.  . Naproxen Sodium (ALEVE PO) Take by mouth as needed.  . niacin 500 MG tablet Take 500 mg by mouth at bedtime.  . Red Yeast Rice 600 MG CAPS Take 2 capsules by mouth daily.  . tamsulosin (FLOMAX) 0.4 MG CAPS capsule Take 1 capsule (0.4 mg total) by mouth daily after supper.   No facility-administered encounter medications on file as of 03/12/2019.     Allergies  Allergen Reactions  . Naproxen     Other reaction(s): Other (See Comments) Burning sensation, break out in sores Other reaction(s): Other (See Comments) Burning sensation, break out in sores    Review of Systems  Constitutional: Positive for malaise/fatigue. Negative for fever.  HENT: Positive for congestion. Negative for sore throat.   Respiratory: Positive for cough, sputum production and shortness of breath.     Objective:  There were no vitals taken for this visit.  Physical Exam: No apparent respiratory distress during telephonic interview.  Assessment and Plan :   1. Cough Occasional morning cough with slight white sputum the past week. No fever, purulent sputum, wheeze, sore throat, GI upset or loss of taste/smell. May try Mucinex at bedtime. Recheck prn. Advised to continue pandemic restrictions.  Follow Up Instructions:  I discussed the assessment and treatment plan with the patient. The  patient was provided an opportunity to ask questions and all were answered. The patient agreed with the plan and demonstrated an understanding of the instructions.   The patient was advised to call back or seek an in-person evaluation if the symptoms worsen or if the condition fails to improve as anticipated.  I provided 10 minutes of non-face-to-face time during this encounter.   Vernie Murders, PA

## 2019-03-15 DIAGNOSIS — M25552 Pain in left hip: Secondary | ICD-10-CM | POA: Diagnosis not present

## 2019-03-15 DIAGNOSIS — M25652 Stiffness of left hip, not elsewhere classified: Secondary | ICD-10-CM | POA: Diagnosis not present

## 2019-03-17 DIAGNOSIS — M25552 Pain in left hip: Secondary | ICD-10-CM | POA: Diagnosis not present

## 2019-03-17 DIAGNOSIS — M25652 Stiffness of left hip, not elsewhere classified: Secondary | ICD-10-CM | POA: Diagnosis not present

## 2019-03-22 DIAGNOSIS — M25652 Stiffness of left hip, not elsewhere classified: Secondary | ICD-10-CM | POA: Diagnosis not present

## 2019-03-22 DIAGNOSIS — M25552 Pain in left hip: Secondary | ICD-10-CM | POA: Diagnosis not present

## 2019-03-23 DIAGNOSIS — Z96642 Presence of left artificial hip joint: Secondary | ICD-10-CM | POA: Diagnosis not present

## 2019-03-24 DIAGNOSIS — M25652 Stiffness of left hip, not elsewhere classified: Secondary | ICD-10-CM | POA: Diagnosis not present

## 2019-03-24 DIAGNOSIS — M25552 Pain in left hip: Secondary | ICD-10-CM | POA: Diagnosis not present

## 2019-03-26 DIAGNOSIS — R972 Elevated prostate specific antigen [PSA]: Secondary | ICD-10-CM | POA: Diagnosis not present

## 2019-03-29 DIAGNOSIS — M25652 Stiffness of left hip, not elsewhere classified: Secondary | ICD-10-CM | POA: Diagnosis not present

## 2019-03-29 DIAGNOSIS — M25552 Pain in left hip: Secondary | ICD-10-CM | POA: Diagnosis not present

## 2019-04-20 ENCOUNTER — Other Ambulatory Visit: Payer: Self-pay

## 2019-04-20 DIAGNOSIS — Z20822 Contact with and (suspected) exposure to covid-19: Secondary | ICD-10-CM

## 2019-04-23 LAB — NOVEL CORONAVIRUS, NAA: SARS-CoV-2, NAA: NOT DETECTED

## 2019-05-12 DIAGNOSIS — C61 Malignant neoplasm of prostate: Secondary | ICD-10-CM | POA: Diagnosis not present

## 2019-08-06 DIAGNOSIS — Z79899 Other long term (current) drug therapy: Secondary | ICD-10-CM | POA: Diagnosis not present

## 2019-08-06 DIAGNOSIS — N6489 Other specified disorders of breast: Secondary | ICD-10-CM | POA: Diagnosis not present

## 2019-08-06 DIAGNOSIS — N644 Mastodynia: Secondary | ICD-10-CM | POA: Diagnosis not present

## 2019-11-12 ENCOUNTER — Telehealth: Payer: Self-pay | Admitting: Family Medicine

## 2019-11-12 NOTE — Telephone Encounter (Signed)
Patient is calling he missed a call. No message was left. Please advise [phone]

## 2020-03-15 ENCOUNTER — Telehealth: Payer: Self-pay

## 2020-03-15 NOTE — Telephone Encounter (Signed)
Copied from [place] [id number]. Topic: General - Call Back - No Documentation >> Mar 15, 2020 10:48 AM Erick Blinks wrote: Call back request from [facility] contact: [phone]

## 2020-03-16 NOTE — Telephone Encounter (Signed)
Spoke with pt 03/15/2020 around 3:15 pm. TNP
